# Patient Record
Sex: Female | Born: 1970 | Race: White | Hispanic: No | Marital: Married | Smoking: Former smoker
Health system: Southern US, Community
[De-identification: ages and names within clinical notes are randomized; demographics above are authoritative.]

## PROBLEM LIST (undated history)

## (undated) DIAGNOSIS — M1990 Unspecified osteoarthritis, unspecified site: Secondary | ICD-10-CM

## (undated) DIAGNOSIS — Z8489 Family history of other specified conditions: Secondary | ICD-10-CM

## (undated) HISTORY — PX: WISDOM TOOTH EXTRACTION: SHX21

## (undated) HISTORY — DX: Unspecified osteoarthritis, unspecified site: M19.90

---

## 2017-02-20 DIAGNOSIS — J189 Pneumonia, unspecified organism: Secondary | ICD-10-CM

## 2017-02-20 HISTORY — DX: Pneumonia, unspecified organism: J18.9

## 2020-03-22 ENCOUNTER — Other Ambulatory Visit: Payer: Self-pay | Admitting: Orthopedic Surgery

## 2020-03-22 DIAGNOSIS — M1711 Unilateral primary osteoarthritis, right knee: Secondary | ICD-10-CM | POA: Insufficient documentation

## 2020-03-22 DIAGNOSIS — N951 Menopausal and female climacteric states: Secondary | ICD-10-CM | POA: Insufficient documentation

## 2020-03-22 DIAGNOSIS — Z01811 Encounter for preprocedural respiratory examination: Secondary | ICD-10-CM

## 2020-03-31 NOTE — Patient Instructions (Addendum)
DUE TO COVID-19 ONLY ONE VISITOR IS ALLOWED TO COME WITH YOU AND STAY IN THE WAITING ROOM ONLY DURING PRE OP AND PROCEDURE DAY OF SURGERY. THE 1 VISITOR  MAY VISIT WITH YOU AFTER SURGERY IN YOUR PRIVATE ROOM DURING VISITING HOURS ONLY!  YOU NEED TO HAVE A COVID 19 TEST ON_2/15______ @_2 :05______, THIS TEST MUST BE DONE BEFORE SURGERY,  COVID TESTING SITE [address] [place] , IT IS ON THE RIGHT GOING OUT [address] APPROXIMATELY  2 MINUTES PAST [employer] ON THE RIGHT. ONCE YOUR COVID TEST IS COMPLETED,  PLEASE BEGIN THE QUARANTINE INSTRUCTIONS AS OUTLINED IN YOUR HANDOUT.                [id number]    Your procedure is scheduled on: 04/09/20   Report to [facility] Main  Entrance   Report to Short stay at 5:30 AM     Call this number if you have problems the morning of surgery [phone]    BRUSH YOUR TEETH MORNING OF SURGERY AND RINSE YOUR MOUTH OUT, NO CHEWING GUM CANDY OR MINTS.  No food after midnight.    You may have clear liquid until 4:30 AM.    At 4:00 AM drink pre surgery drink.   Nothing by mouth after 4:30 AM.    Take these medicines the morning of surgery with A SIP OF WATER: None                                 You may not have any metal on your body including hair pins and              piercings  Do not wear jewelry, make-up, lotions, powders or perfumes, deodorant             Do not wear nail polish on your fingernails.  Do not shave  48 hours prior to surgery.     Do not bring valuables to the hospital. [facility] IS NOT             RESPONSIBLE   FOR VALUABLES.  Contacts, dentures or bridgework may not be worn into surgery.      Patients discharged the day of surgery will not be allowed to drive home  . IF YOU ARE HAVING SURGERY AND GOING HOME THE SAME DAY, YOU MUST HAVE AN ADULT TO DRIVE YOU HOME AND BE WITH YOU FOR 24 HOURS.  YOU MAY GO HOME BY TAXI OR UBER OR ORTHERWISE, BUT AN ADULT MUST ACCOMPANY  YOU HOME AND STAY WITH YOU FOR 24 HOURS.  Name and phone number of your driver:  Special Instructions: N/A              Please read over the following fact sheets you were given: _____________________________________________________________________             [facility] - Preparing for Surgery Before surgery, you can play an important role.  Because skin is not sterile, your skin needs to be as free of germs as possible.  You can reduce the number of germs on your skin by washing with CHG (chlorahexidine gluconate) soap before surgery.  CHG is an antiseptic cleaner which kills germs and bonds with the skin to continue killing germs even after washing. Please DO NOT use if you have an allergy to CHG or antibacterial soaps.  If your skin becomes reddened/irritated stop using  the CHG and inform your nurse when you arrive at Short Stay. Do not shave (including legs and underarms) for at least 48 hours prior to the first CHG shower. . Please follow these instructions carefully:  1.  Shower with CHG Soap the night before surgery and the  morning of Surgery.  2.  If you choose to wash your hair, wash your hair first as usual with your  normal  shampoo.  3.  After you shampoo, rinse your hair and body thoroughly to remove the  shampoo.                                        4.  Use CHG as you would any other liquid soap.  You can apply chg directly  to the skin and wash                       Gently with a scrungie or clean washcloth.  5.  Apply the CHG Soap to your body ONLY FROM THE NECK DOWN.   Do not use on face/ open                           Wound or open sores. Avoid contact with eyes, ears mouth and genitals (private parts).                       Wash face,  Genitals (private parts) with your normal soap.             6.  Wash thoroughly, paying special attention to the area where your surgery  will be performed.  7.  Thoroughly rinse your body with warm water from the neck down.  8.  DO NOT  shower/wash with your normal soap after using and rinsing off  the CHG Soap.             9.  Pat yourself dry with a clean towel.            10.  Wear clean pajamas.            11.  Place clean sheets on your bed the night of your first shower and do not  sleep with pets. Day of Surgery : Do not apply any lotions/deodorants the morning of surgery.  Please wear clean clothes to the hospital/surgery center.  FAILURE TO FOLLOW THESE INSTRUCTIONS MAY RESULT IN THE CANCELLATION OF YOUR SURGERY PATIENT SIGNATURE_________________________________  NURSE SIGNATURE__________________________________  ________________________________________________________________________   Abigail Snyder  An incentive spirometer is a tool that can help keep your lungs clear and active. This tool measures how well you are filling your lungs with each breath. Taking long deep breaths may help reverse or decrease the chance of developing breathing (pulmonary) problems (especially infection) following:  A long period of time when you are unable to move or be active. BEFORE THE PROCEDURE   If the spirometer includes an indicator to show your best effort, your nurse or respiratory therapist will set it to a desired goal.  If possible, sit up straight or lean slightly forward. Try not to slouch.  Hold the incentive spirometer in an upright position. INSTRUCTIONS FOR USE  1. Sit on the edge of your bed if possible, or sit up as far as you can in bed or on a chair. 2.  Hold the incentive spirometer in an upright position. 3. Breathe out normally. 4. Place the mouthpiece in your mouth and seal your lips tightly around it. 5. Breathe in slowly and as deeply as possible, raising the piston or the ball toward the top of the column. 6. Hold your breath for 3-5 seconds or for as long as possible. Allow the piston or ball to fall to the bottom of the column. 7. Remove the mouthpiece from your mouth and breathe out  normally. 8. Rest for a few seconds and repeat Steps 1 through 7 at least 10 times every 1-2 hours when you are awake. Take your time and take a few normal breaths between deep breaths. 9. The spirometer may include an indicator to show your best effort. Use the indicator as a goal to work toward during each repetition. 10. After each set of 10 deep breaths, practice coughing to be sure your lungs are clear. If you have an incision (the cut made at the time of surgery), support your incision when coughing by placing a pillow or rolled up towels firmly against it. Once you are able to get out of bed, walk around indoors and cough well. You may stop using the incentive spirometer when instructed by your caregiver.  RISKS AND COMPLICATIONS  Take your time so you do not get dizzy or light-headed.  If you are in pain, you may need to take or ask for pain medication before doing incentive spirometry. It is harder to take a deep breath if you are having pain. AFTER USE  Rest and breathe slowly and easily.  It can be helpful to keep track of a log of your progress. Your caregiver can provide you with a simple table to help with this. If you are using the spirometer at home, follow these instructions: SEEK MEDICAL CARE IF:   You are having difficultly using the spirometer.  You have trouble using the spirometer as often as instructed.  Your pain medication is not giving enough relief while using the spirometer.  You develop fever of 100.5 F (38.1 C) or higher. SEEK IMMEDIATE MEDICAL CARE IF:   You cough up bloody sputum that had not been present before.  You develop fever of 102 F (38.9 C) or greater.  You develop worsening pain at or near the incision site. MAKE SURE YOU:   Understand these instructions.  Will watch your condition.  Will get help right away if you are not doing well or get worse. Document Released: 06/19/2006 Document Revised: 05/01/2011 Document Reviewed:  08/20/2006 [facility] Patient Information 2014 [place], [place].   ________________________________________________________________________

## 2020-04-01 ENCOUNTER — Ambulatory Visit (HOSPITAL_COMMUNITY)
Admission: RE | Admit: 2020-04-01 | Discharge: 2020-04-01 | Disposition: A | Discharge status: Home / Self Care | Payer: 59 | Source: Ambulatory Visit | Attending: Orthopedic Surgery | Admitting: Orthopedic Surgery

## 2020-04-01 ENCOUNTER — Encounter (HOSPITAL_COMMUNITY)
Admission: RE | Admit: 2020-04-01 | Discharge: 2020-04-01 | Disposition: A | Discharge status: Home / Self Care | Payer: 59 | Source: Ambulatory Visit | Attending: Orthopedic Surgery | Admitting: Orthopedic Surgery

## 2020-04-01 ENCOUNTER — Other Ambulatory Visit: Payer: Self-pay

## 2020-04-01 ENCOUNTER — Encounter (HOSPITAL_COMMUNITY): Payer: Self-pay

## 2020-04-01 ENCOUNTER — Encounter (INDEPENDENT_AMBULATORY_CARE_PROVIDER_SITE_OTHER): Payer: Self-pay

## 2020-04-01 DIAGNOSIS — Z01811 Encounter for preprocedural respiratory examination: Secondary | ICD-10-CM | POA: Diagnosis not present

## 2020-04-01 HISTORY — DX: Family history of other specified conditions: Z84.89

## 2020-04-01 LAB — CBC WITH DIFFERENTIAL/PLATELET
Abs Immature Granulocytes: 0.01 10*3/uL (ref 0.00–0.07)
Basophils Absolute: 0 10*3/uL (ref 0.0–0.1)
Basophils Relative: 1 %
Eosinophils Absolute: 0.1 10*3/uL (ref 0.0–0.5)
Eosinophils Relative: 1 %
HCT: 43.9 % (ref 36.0–46.0)
Hemoglobin: 14.4 g/dL (ref 12.0–15.0)
Immature Granulocytes: 0 %
Lymphocytes Relative: 35 %
Lymphs Abs: 2.3 10*3/uL (ref 0.7–4.0)
MCH: 31 pg (ref 26.0–34.0)
MCHC: 32.8 g/dL (ref 30.0–36.0)
MCV: 94.6 fL (ref 80.0–100.0)
Monocytes Absolute: 0.5 10*3/uL (ref 0.1–1.0)
Monocytes Relative: 8 %
Neutro Abs: 3.6 10*3/uL (ref 1.7–7.7)
Neutrophils Relative %: 55 %
Platelets: 184 10*3/uL (ref 150–400)
RBC: 4.64 MIL/uL (ref 3.87–5.11)
RDW: 12.7 % (ref 11.5–15.5)
WBC: 6.5 10*3/uL (ref 4.0–10.5)
nRBC: 0 % (ref 0.0–0.2)

## 2020-04-01 LAB — COMPREHENSIVE METABOLIC PANEL
ALT: 17 U/L (ref 0–44)
AST: 17 U/L (ref 15–41)
Albumin: 4.4 g/dL (ref 3.5–5.0)
Alkaline Phosphatase: 68 U/L (ref 38–126)
Anion gap: 8 (ref 5–15)
BUN: 16 mg/dL (ref 6–20)
CO2: 27 mmol/L (ref 22–32)
Calcium: 9.3 mg/dL (ref 8.9–10.3)
Chloride: 104 mmol/L (ref 98–111)
Creatinine, Ser: 0.54 mg/dL (ref 0.44–1.00)
GFR, Estimated: >60 mL/min (ref >60)
Glucose, Bld: 95 mg/dL (ref 70–99)
Potassium: 4.7 mmol/L (ref 3.5–5.1)
Sodium: 139 mmol/L (ref 135–145)
Total Bilirubin: 0.7 mg/dL (ref 0.3–1.2)
Total Protein: 7.7 g/dL (ref 6.5–8.1)

## 2020-04-01 LAB — URINALYSIS, ROUTINE W REFLEX MICROSCOPIC
Bilirubin Urine: NEGATIVE
Glucose, UA: NEGATIVE mg/dL
Hgb urine dipstick: NEGATIVE
Ketones, ur: NEGATIVE mg/dL
Leukocytes,Ua: NEGATIVE
Nitrite: NEGATIVE
Protein, ur: NEGATIVE mg/dL
Specific Gravity, Urine: 1.016 (ref 1.005–1.030)
pH: 7 (ref 5.0–8.0)

## 2020-04-01 LAB — SURGICAL PCR SCREEN
MRSA, PCR: NEGATIVE
Staphylococcus aureus: NEGATIVE

## 2020-04-01 LAB — APTT: aPTT: 30 seconds (ref 24–36)

## 2020-04-01 LAB — PROTIME-INR
INR: 1 (ref 0.8–1.2)
Prothrombin Time: 13.1 seconds (ref 11.4–15.2)

## 2020-04-01 NOTE — Progress Notes (Addendum)
COVID Vaccine Completed:Yes Date COVID Vaccine completed:10/11/19 -Booster 02/10/20-Moderna COVID vaccine manufacturer: Pfizer       PCP - Dr. Rejeana Brock Cardiologist - no  Chest x-ray - 04/12/19-epic EKG - 03/22/20-epic Stress Test - no ECHO - no Cardiac Cath - no Pacemaker/ICD device last checked:NA  Sleep Study - no CPAP -   Fasting Blood Sugar - NA Checks Blood Sugar _____ times a day  Blood Thinner Instructions:NA Aspirin Instructions: Last Dose:  Anesthesia review:   Patient denies shortness of breath, fever, cough and chest pain at PAT appointment yes  Patient verbalized understanding of instructions that were given to them at the PAT appointment. Patient was also instructed that they will need to review over the PAT instructions again at home before surgery. Yes. pt has a physical job and no SOB with any activities.

## 2020-04-06 ENCOUNTER — Other Ambulatory Visit (HOSPITAL_COMMUNITY)
Admission: RE | Admit: 2020-04-06 | Discharge: 2020-04-06 | Disposition: A | Discharge status: Home / Self Care | Payer: 59 | Source: Ambulatory Visit | Attending: Orthopedic Surgery | Admitting: Orthopedic Surgery

## 2020-04-06 DIAGNOSIS — Z20822 Contact with and (suspected) exposure to covid-19: Secondary | ICD-10-CM | POA: Diagnosis not present

## 2020-04-06 DIAGNOSIS — Z01812 Encounter for preprocedural laboratory examination: Secondary | ICD-10-CM | POA: Diagnosis present

## 2020-04-06 LAB — SARS CORONAVIRUS 2 (TAT 6-24 HRS): SARS Coronavirus 2: NEGATIVE

## 2020-04-08 ENCOUNTER — Encounter (HOSPITAL_COMMUNITY): Payer: Self-pay | Admitting: Orthopedic Surgery

## 2020-04-08 MED ORDER — BUPIVACAINE LIPOSOME 1.3 % IJ SUSP
20.0000 mL | Freq: Once | INTRAMUSCULAR | Status: DC
  Filled 2020-04-08: qty 20

## 2020-04-08 NOTE — Anesthesia Preprocedure Evaluation (Addendum)
Anesthesia Evaluation  Patient identified by MRN, date of birth, ID band Patient awake    Reviewed: Allergy & Precautions, NPO status , Patient's Chart, lab work & pertinent test results  Airway Mallampati: II  TM Distance: >3 FB Neck ROM: Full    Dental  (+) Dental Advisory Given   Pulmonary former smoker,    breath sounds clear to auscultation       Cardiovascular negative cardio ROS   Rhythm:Regular Rate:Normal     Neuro/Psych negative neurological ROS     GI/Hepatic negative GI ROS, Neg liver ROS,   Endo/Other  negative endocrine ROS  Renal/GU negative Renal ROS     Musculoskeletal   Abdominal   Peds  Hematology negative hematology ROS (+)   Anesthesia Other Findings   Reproductive/Obstetrics                            Lab Results  Component Value Date   WBC 6.5 04/01/2020   HGB 14.4 04/01/2020   HCT 43.9 04/01/2020   MCV 94.6 04/01/2020   PLT 184 04/01/2020   Lab Results  Component Value Date   CREATININE 0.54 04/01/2020   BUN 16 04/01/2020   NA 139 04/01/2020   K 4.7 04/01/2020   CL 104 04/01/2020   CO2 27 04/01/2020    Anesthesia Physical Anesthesia Plan  ASA: I  Anesthesia Plan: Spinal   Post-op Pain Management:  Regional for Post-op pain   Induction:   PONV Risk Score and Plan: 2 and Propofol infusion, Ondansetron and Treatment may vary due to age or medical condition  Airway Management Planned: Natural Airway and Simple Face Mask  Additional Equipment:   Intra-op Plan:   Post-operative Plan:   Informed Consent: I have reviewed the patients History and Physical, chart, labs and discussed the procedure including the risks, benefits and alternatives for the proposed anesthesia with the patient or authorized representative who has indicated his/her understanding and acceptance.       Plan Discussed with: CRNA  Anesthesia Plan Comments:         Anesthesia Quick Evaluation

## 2020-04-09 ENCOUNTER — Observation Stay (HOSPITAL_COMMUNITY)
Admission: RE | Admit: 2020-04-09 | Discharge: 2020-04-10 | Disposition: A | Discharge status: Home / Self Care | Payer: 59 | Attending: Orthopedic Surgery | Admitting: Orthopedic Surgery

## 2020-04-09 ENCOUNTER — Encounter (HOSPITAL_COMMUNITY)
Admission: RE | Disposition: A | Discharge status: Home / Self Care | Payer: Self-pay | Source: Home / Self Care | Attending: Orthopedic Surgery

## 2020-04-09 ENCOUNTER — Ambulatory Visit (HOSPITAL_COMMUNITY): Payer: 59 | Admitting: Anesthesiology

## 2020-04-09 ENCOUNTER — Encounter (HOSPITAL_COMMUNITY): Payer: Self-pay | Admitting: Orthopedic Surgery

## 2020-04-09 DIAGNOSIS — M25561 Pain in right knee: Secondary | ICD-10-CM | POA: Diagnosis present

## 2020-04-09 DIAGNOSIS — M1711 Unilateral primary osteoarthritis, right knee: Principal | ICD-10-CM | POA: Insufficient documentation

## 2020-04-09 DIAGNOSIS — Z87891 Personal history of nicotine dependence: Secondary | ICD-10-CM | POA: Insufficient documentation

## 2020-04-09 DIAGNOSIS — Z96651 Presence of right artificial knee joint: Secondary | ICD-10-CM

## 2020-04-09 HISTORY — PX: TOTAL KNEE ARTHROPLASTY: SHX125

## 2020-04-09 LAB — TYPE AND SCREEN
ABO/RH(D): O NEG
Antibody Screen: NEGATIVE

## 2020-04-09 LAB — ABO/RH: ABO/RH(D): O NEG

## 2020-04-09 SURGERY — ARTHROPLASTY, KNEE, TOTAL
Anesthesia: Spinal | Site: Knee | Laterality: Right

## 2020-04-09 MED ORDER — DEXAMETHASONE SODIUM PHOSPHATE 10 MG/ML IJ SOLN
10.0000 mg | Freq: Two times a day (BID) | INTRAMUSCULAR | Status: DC
  Administered 2020-04-10: 10 mg via INTRAVENOUS
  Filled 2020-04-09: qty 1

## 2020-04-09 MED ORDER — LACTATED RINGERS IV BOLUS
500.0000 mL | Freq: Once | INTRAVENOUS | Status: AC
  Administered 2020-04-09: 500 mL via INTRAVENOUS

## 2020-04-09 MED ORDER — PROPOFOL 10 MG/ML IV BOLUS
INTRAVENOUS | Status: DC | PRN
  Administered 2020-04-09: 20 mg via INTRAVENOUS
  Administered 2020-04-09: 130 mg via INTRAVENOUS

## 2020-04-09 MED ORDER — DIPHENHYDRAMINE HCL 12.5 MG/5ML PO ELIX: 12.5000 mg | ORAL_SOLUTION | ORAL | Status: DC | PRN

## 2020-04-09 MED ORDER — BISACODYL 5 MG PO TBEC: 5.0000 mg | DELAYED_RELEASE_TABLET | Freq: Every day | ORAL | Status: DC | PRN

## 2020-04-09 MED ORDER — PHENYLEPHRINE HCL-NACL 10-0.9 MG/250ML-% IV SOLN
INTRAVENOUS | Status: DC | PRN
  Administered 2020-04-09: 15 ug/min via INTRAVENOUS

## 2020-04-09 MED ORDER — DEXAMETHASONE SODIUM PHOSPHATE 10 MG/ML IJ SOLN
INTRAMUSCULAR | Status: DC | PRN
  Administered 2020-04-09: 8 mg via INTRAVENOUS

## 2020-04-09 MED ORDER — EPHEDRINE SULFATE-NACL 50-0.9 MG/10ML-% IV SOSY
PREFILLED_SYRINGE | INTRAVENOUS | Status: DC | PRN
  Administered 2020-04-09: 5 mg via INTRAVENOUS

## 2020-04-09 MED ORDER — BUPIVACAINE-EPINEPHRINE (PF) 0.5% -1:200000 IJ SOLN
INTRAMUSCULAR | Status: AC
  Filled 2020-04-09: qty 30

## 2020-04-09 MED ORDER — SODIUM CHLORIDE 0.9 % IV SOLN
INTRAVENOUS | Status: DC
  Administered 2020-04-09: 1000 mL via INTRAVENOUS

## 2020-04-09 MED ORDER — AMISULPRIDE (ANTIEMETIC) 5 MG/2ML IV SOLN
INTRAVENOUS | Status: AC
  Filled 2020-04-09: qty 4

## 2020-04-09 MED ORDER — PHENOL 1.4 % MT LIQD: 1.0000 | OROMUCOSAL | Status: DC | PRN

## 2020-04-09 MED ORDER — FENTANYL CITRATE (PF) 100 MCG/2ML IJ SOLN
INTRAMUSCULAR | Status: DC | PRN
  Administered 2020-04-09 (×3): 25 ug via INTRAVENOUS
  Administered 2020-04-09 (×2): 50 ug via INTRAVENOUS
  Administered 2020-04-09: 25 ug via INTRAVENOUS

## 2020-04-09 MED ORDER — METHOCARBAMOL 500 MG IVPB - SIMPLE MED
INTRAVENOUS | Status: AC
  Administered 2020-04-09: 500 mg via INTRAVENOUS
  Filled 2020-04-09: qty 50

## 2020-04-09 MED ORDER — FENTANYL CITRATE (PF) 100 MCG/2ML IJ SOLN
INTRAMUSCULAR | Status: AC
  Filled 2020-04-09: qty 2

## 2020-04-09 MED ORDER — POLYETHYLENE GLYCOL 3350 17 G PO PACK: 17.0000 g | PACK | Freq: Every day | ORAL | Status: DC | PRN

## 2020-04-09 MED ORDER — ALUM & MAG HYDROXIDE-SIMETH 200-200-20 MG/5ML PO SUSP
30.0000 mL | ORAL | Status: DC | PRN
  Filled 2020-04-09: qty 30

## 2020-04-09 MED ORDER — HYDROMORPHONE HCL 1 MG/ML IJ SOLN
INTRAMUSCULAR | Status: AC
  Administered 2020-04-09: 1 mg via INTRAVENOUS
  Filled 2020-04-09: qty 1

## 2020-04-09 MED ORDER — PROPOFOL 500 MG/50ML IV EMUL
INTRAVENOUS | Status: AC
  Filled 2020-04-09: qty 50

## 2020-04-09 MED ORDER — OXYCODONE HCL 5 MG PO TABS
10.0000 mg | ORAL_TABLET | ORAL | Status: DC | PRN
  Administered 2020-04-09 – 2020-04-10 (×2): 15 mg via ORAL
  Filled 2020-04-09 (×2): qty 3

## 2020-04-09 MED ORDER — DEXMEDETOMIDINE (PRECEDEX) IN NS 20 MCG/5ML (4 MCG/ML) IV SYRINGE
PREFILLED_SYRINGE | INTRAVENOUS | Status: DC | PRN
  Administered 2020-04-09 (×2): 4 ug via INTRAVENOUS

## 2020-04-09 MED ORDER — METOCLOPRAMIDE HCL 5 MG/ML IJ SOLN
5.0000 mg | Freq: Three times a day (TID) | INTRAMUSCULAR | Status: DC | PRN
  Administered 2020-04-09: 10 mg via INTRAVENOUS

## 2020-04-09 MED ORDER — ACETAMINOPHEN 500 MG PO TABS
1000.0000 mg | ORAL_TABLET | Freq: Once | ORAL | Status: AC
  Administered 2020-04-09: 1000 mg via ORAL
  Filled 2020-04-09: qty 2

## 2020-04-09 MED ORDER — SODIUM CHLORIDE 0.9 % IR SOLN
Status: DC | PRN
  Administered 2020-04-09: 1000 mL

## 2020-04-09 MED ORDER — ONDANSETRON HCL 4 MG PO TABS: 4.0000 mg | ORAL_TABLET | Freq: Four times a day (QID) | ORAL | Status: DC | PRN

## 2020-04-09 MED ORDER — LACTATED RINGERS IV SOLN: INTRAVENOUS | Status: DC

## 2020-04-09 MED ORDER — AMISULPRIDE (ANTIEMETIC) 5 MG/2ML IV SOLN
10.0000 mg | Freq: Once | INTRAVENOUS | Status: AC | PRN
  Administered 2020-04-09: 10 mg via INTRAVENOUS

## 2020-04-09 MED ORDER — BUPIVACAINE LIPOSOME 1.3 % IJ SUSP
INTRAMUSCULAR | Status: DC | PRN
  Administered 2020-04-09: 20 mL

## 2020-04-09 MED ORDER — POVIDONE-IODINE 10 % EX SWAB
2.0000 "application " | Freq: Once | CUTANEOUS | Status: AC
  Administered 2020-04-09: 2 via TOPICAL

## 2020-04-09 MED ORDER — METOCLOPRAMIDE HCL 5 MG/ML IJ SOLN
INTRAMUSCULAR | Status: AC
  Filled 2020-04-09: qty 2

## 2020-04-09 MED ORDER — METOCLOPRAMIDE HCL 5 MG PO TABS: 5.0000 mg | ORAL_TABLET | Freq: Three times a day (TID) | ORAL | Status: DC | PRN

## 2020-04-09 MED ORDER — ONDANSETRON HCL 4 MG/2ML IJ SOLN
INTRAMUSCULAR | Status: AC
  Filled 2020-04-09: qty 2

## 2020-04-09 MED ORDER — HYDROMORPHONE HCL 1 MG/ML IJ SOLN
INTRAMUSCULAR | Status: AC
  Filled 2020-04-09: qty 1

## 2020-04-09 MED ORDER — EPHEDRINE 5 MG/ML INJ
INTRAVENOUS | Status: AC
  Filled 2020-04-09: qty 10

## 2020-04-09 MED ORDER — ONDANSETRON HCL 4 MG/2ML IJ SOLN
INTRAMUSCULAR | Status: AC
  Administered 2020-04-09: 4 mg via INTRAVENOUS
  Filled 2020-04-09: qty 2

## 2020-04-09 MED ORDER — GLYCOPYRROLATE PF 0.2 MG/ML IJ SOSY
PREFILLED_SYRINGE | INTRAMUSCULAR | Status: AC
  Filled 2020-04-09: qty 2

## 2020-04-09 MED ORDER — LIDOCAINE 2% (20 MG/ML) 5 ML SYRINGE
INTRAMUSCULAR | Status: DC | PRN
  Administered 2020-04-09: 60 mg via INTRAVENOUS
  Administered 2020-04-09: 40 mg via INTRAVENOUS

## 2020-04-09 MED ORDER — OXYCODONE HCL 5 MG PO TABS
ORAL_TABLET | ORAL | Status: AC
  Filled 2020-04-09: qty 1

## 2020-04-09 MED ORDER — BUPIVACAINE-EPINEPHRINE 0.5% -1:200000 IJ SOLN
INTRAMUSCULAR | Status: DC | PRN
  Administered 2020-04-09: 30 mL

## 2020-04-09 MED ORDER — TRANEXAMIC ACID-NACL 1000-0.7 MG/100ML-% IV SOLN: 1000.0000 mg | Freq: Once | INTRAVENOUS | Status: AC

## 2020-04-09 MED ORDER — WATER FOR IRRIGATION, STERILE IR SOLN
Status: DC | PRN
  Administered 2020-04-09: 2000 mL

## 2020-04-09 MED ORDER — ONDANSETRON HCL 4 MG/2ML IJ SOLN
4.0000 mg | Freq: Four times a day (QID) | INTRAMUSCULAR | Status: DC | PRN
  Administered 2020-04-09: 4 mg via INTRAVENOUS
  Filled 2020-04-09: qty 2

## 2020-04-09 MED ORDER — MAGNESIUM CITRATE PO SOLN
1.0000 | Freq: Once | ORAL | Status: DC | PRN
Start: 2020-04-09 — End: 2020-04-10

## 2020-04-09 MED ORDER — CEFAZOLIN SODIUM-DEXTROSE 2-4 GM/100ML-% IV SOLN: 2.0000 g | Freq: Four times a day (QID) | INTRAVENOUS | Status: DC

## 2020-04-09 MED ORDER — ASPIRIN EC 325 MG PO TBEC
325.0000 mg | DELAYED_RELEASE_TABLET | Freq: Two times a day (BID) | ORAL | Status: DC
  Administered 2020-04-09 – 2020-04-10 (×2): 325 mg via ORAL
  Filled 2020-04-09 (×2): qty 1

## 2020-04-09 MED ORDER — CEFAZOLIN SODIUM-DEXTROSE 2-4 GM/100ML-% IV SOLN
2.0000 g | INTRAVENOUS | Status: AC
  Administered 2020-04-09: 2 g via INTRAVENOUS
  Filled 2020-04-09: qty 100

## 2020-04-09 MED ORDER — KETOROLAC TROMETHAMINE 30 MG/ML IJ SOLN
INTRAMUSCULAR | Status: AC
  Filled 2020-04-09: qty 1

## 2020-04-09 MED ORDER — LACTATED RINGERS IV BOLUS: 250.0000 mL | Freq: Once | INTRAVENOUS | Status: DC

## 2020-04-09 MED ORDER — ONDANSETRON HCL 4 MG/2ML IJ SOLN
INTRAMUSCULAR | Status: DC | PRN
  Administered 2020-04-09: 4 mg via INTRAVENOUS

## 2020-04-09 MED ORDER — BUPIVACAINE IN DEXTROSE 0.75-8.25 % IT SOLN
INTRATHECAL | Status: DC | PRN
  Administered 2020-04-09: 1.6 mL via INTRATHECAL

## 2020-04-09 MED ORDER — OXYCODONE HCL 5 MG PO TABS
5.0000 mg | ORAL_TABLET | ORAL | Status: DC | PRN
  Administered 2020-04-09: 5 mg via ORAL

## 2020-04-09 MED ORDER — 0.9 % SODIUM CHLORIDE (POUR BTL) OPTIME
TOPICAL | Status: DC | PRN
  Administered 2020-04-09: 1000 mL

## 2020-04-09 MED ORDER — LIDOCAINE HCL (PF) 2 % IJ SOLN
INTRAMUSCULAR | Status: AC
  Filled 2020-04-09: qty 5

## 2020-04-09 MED ORDER — FENTANYL CITRATE (PF) 100 MCG/2ML IJ SOLN
25.0000 ug | INTRAMUSCULAR | Status: DC | PRN
  Administered 2020-04-09 (×3): 50 ug via INTRAVENOUS

## 2020-04-09 MED ORDER — KETOROLAC TROMETHAMINE 30 MG/ML IJ SOLN: 30.0000 mg | Freq: Once | INTRAMUSCULAR | Status: DC

## 2020-04-09 MED ORDER — HYDROMORPHONE HCL 1 MG/ML IJ SOLN
0.5000 mg | INTRAMUSCULAR | Status: DC | PRN
  Administered 2020-04-09 (×3): 0.5 mg via INTRAVENOUS

## 2020-04-09 MED ORDER — METHOCARBAMOL 500 MG IVPB - SIMPLE MED
500.0000 mg | Freq: Four times a day (QID) | INTRAVENOUS | Status: DC | PRN
  Filled 2020-04-09: qty 50

## 2020-04-09 MED ORDER — CHLORHEXIDINE GLUCONATE CLOTH 2 % EX PADS: 6.0000 | MEDICATED_PAD | Freq: Every day | CUTANEOUS | Status: DC

## 2020-04-09 MED ORDER — CHLORHEXIDINE GLUCONATE 0.12 % MT SOLN: 15.0000 mL | Freq: Once | OROMUCOSAL | Status: AC

## 2020-04-09 MED ORDER — ORAL CARE MOUTH RINSE
15.0000 mL | Freq: Once | OROMUCOSAL | Status: AC
  Administered 2020-04-09: 15 mL via OROMUCOSAL

## 2020-04-09 MED ORDER — METHOCARBAMOL 500 MG PO TABS
500.0000 mg | ORAL_TABLET | Freq: Four times a day (QID) | ORAL | Status: DC | PRN
  Administered 2020-04-09 – 2020-04-10 (×3): 500 mg via ORAL
  Filled 2020-04-09 (×3): qty 1

## 2020-04-09 MED ORDER — CEFAZOLIN SODIUM-DEXTROSE 2-4 GM/100ML-% IV SOLN
2.0000 g | Freq: Once | INTRAVENOUS | Status: AC
  Administered 2020-04-09: 2 g via INTRAVENOUS
  Filled 2020-04-09: qty 100

## 2020-04-09 MED ORDER — DEXAMETHASONE SODIUM PHOSPHATE 10 MG/ML IJ SOLN
INTRAMUSCULAR | Status: AC
  Filled 2020-04-09: qty 1

## 2020-04-09 MED ORDER — ROPIVACAINE HCL 5 MG/ML IJ SOLN
INTRAMUSCULAR | Status: DC | PRN
Start: 2020-04-09 — End: 2020-04-09
  Administered 2020-04-09: 20 mL via PERINEURAL

## 2020-04-09 MED ORDER — KETOROLAC TROMETHAMINE 30 MG/ML IJ SOLN
30.0000 mg | Freq: Once | INTRAMUSCULAR | Status: AC
  Administered 2020-04-09: 30 mg via INTRAVENOUS

## 2020-04-09 MED ORDER — DOCUSATE SODIUM 100 MG PO CAPS
100.0000 mg | ORAL_CAPSULE | Freq: Two times a day (BID) | ORAL | Status: DC
  Administered 2020-04-09 – 2020-04-10 (×2): 100 mg via ORAL
  Filled 2020-04-09 (×2): qty 1

## 2020-04-09 MED ORDER — HYDROMORPHONE HCL 1 MG/ML IJ SOLN
0.5000 mg | INTRAMUSCULAR | Status: DC | PRN
  Administered 2020-04-10: 0.5 mg via INTRAVENOUS
  Administered 2020-04-10 (×2): 1 mg via INTRAVENOUS
  Filled 2020-04-09 (×3): qty 1

## 2020-04-09 MED ORDER — CEFAZOLIN SODIUM-DEXTROSE 2-4 GM/100ML-% IV SOLN
INTRAVENOUS | Status: AC
  Administered 2020-04-09: 2 g via INTRAVENOUS
  Filled 2020-04-09: qty 100

## 2020-04-09 MED ORDER — TRANEXAMIC ACID-NACL 1000-0.7 MG/100ML-% IV SOLN
1000.0000 mg | INTRAVENOUS | Status: AC
  Administered 2020-04-09: 1000 mg via INTRAVENOUS
  Filled 2020-04-09: qty 100

## 2020-04-09 MED ORDER — ACETAMINOPHEN 325 MG PO TABS
325.0000 mg | ORAL_TABLET | Freq: Four times a day (QID) | ORAL | Status: DC | PRN
  Filled 2020-04-09: qty 2

## 2020-04-09 MED ORDER — TRANEXAMIC ACID-NACL 1000-0.7 MG/100ML-% IV SOLN
INTRAVENOUS | Status: AC
  Administered 2020-04-09: 1000 mg via INTRAVENOUS
  Filled 2020-04-09: qty 100

## 2020-04-09 MED ORDER — PROPOFOL 500 MG/50ML IV EMUL
INTRAVENOUS | Status: DC | PRN
  Administered 2020-04-09: 135 ug/kg/min via INTRAVENOUS

## 2020-04-09 MED ORDER — MIDAZOLAM HCL 5 MG/5ML IJ SOLN
INTRAMUSCULAR | Status: DC | PRN
  Administered 2020-04-09: 2 mg via INTRAVENOUS

## 2020-04-09 MED ORDER — PROPOFOL 1000 MG/100ML IV EMUL
INTRAVENOUS | Status: AC
  Filled 2020-04-09: qty 100

## 2020-04-09 MED ORDER — SODIUM CHLORIDE 0.9% FLUSH
INTRAVENOUS | Status: DC | PRN
  Administered 2020-04-09: 50 mL

## 2020-04-09 MED ORDER — MENTHOL 3 MG MT LOZG: 1.0000 | LOZENGE | OROMUCOSAL | Status: DC | PRN

## 2020-04-09 MED ORDER — PHENYLEPHRINE HCL-NACL 10-0.9 MG/250ML-% IV SOLN
INTRAVENOUS | Status: AC
  Filled 2020-04-09: qty 250

## 2020-04-09 MED ORDER — PROPOFOL 10 MG/ML IV BOLUS
INTRAVENOUS | Status: AC
  Filled 2020-04-09: qty 40

## 2020-04-09 MED ORDER — MIDAZOLAM HCL 2 MG/2ML IJ SOLN
INTRAMUSCULAR | Status: AC
  Filled 2020-04-09: qty 2

## 2020-04-09 SURGICAL SUPPLY — 52 items
ATTUNE MED DOME PAT 38 KNEE (Knees) ×2 IMPLANT
ATTUNE PS FEM RT SZ 5 CEM KNEE (Femur) ×2 IMPLANT
ATTUNE PSRP INSR SZ 5 10M KNEE (Insert) ×2 IMPLANT
BAG ZIPLOCK 12X15 (MISCELLANEOUS) ×2 IMPLANT
BASE TIBIAL ROT PLAT SZ 5 KNEE (Knees) ×1 IMPLANT
BENZOIN TINCTURE PRP APPL 2/3 (GAUZE/BANDAGES/DRESSINGS) ×2 IMPLANT
BLADE SAGITTAL 25.0X1.19X90 (BLADE) ×2 IMPLANT
BLADE SAW SGTL 13.0X1.19X90.0M (BLADE) ×2 IMPLANT
BLADE SURG SZ10 CARB STEEL (BLADE) ×4 IMPLANT
BNDG ELASTIC 6X5.8 VLCR STR LF (GAUZE/BANDAGES/DRESSINGS) ×2 IMPLANT
BOOTIES KNEE HIGH SLOAN (MISCELLANEOUS) ×2 IMPLANT
BOWL SMART MIX CTS (DISPOSABLE) ×2 IMPLANT
CEMENT HV SMART SET (Cement) ×4 IMPLANT
COVER SURGICAL LIGHT HANDLE (MISCELLANEOUS) ×2 IMPLANT
COVER WAND RF STERILE (DRAPES) ×2 IMPLANT
CUFF TOURN SGL QUICK 34 (TOURNIQUET CUFF) ×1
CUFF TRNQT CYL 34X4.125X (TOURNIQUET CUFF) ×1 IMPLANT
DECANTER SPIKE VIAL GLASS SM (MISCELLANEOUS) ×4 IMPLANT
DRAPE U-SHAPE 47X51 STRL (DRAPES) ×2 IMPLANT
DRSG AQUACEL AG ADV 3.5X10 (GAUZE/BANDAGES/DRESSINGS) ×2 IMPLANT
DURAPREP 26ML APPLICATOR (WOUND CARE) ×2 IMPLANT
ELECT REM PT RETURN 15FT ADLT (MISCELLANEOUS) ×2 IMPLANT
GLOVE SRG 8 PF TXTR STRL LF DI (GLOVE) ×2 IMPLANT
GLOVE SURG LTX SZ7.5 (GLOVE) ×4 IMPLANT
GLOVE SURG UNDER POLY LF SZ8 (GLOVE) ×2
GOWN STRL REUS W/TWL XL LVL3 (GOWN DISPOSABLE) ×4 IMPLANT
HANDPIECE INTERPULSE COAX TIP (DISPOSABLE) ×1
HOLDER FOLEY CATH W/STRAP (MISCELLANEOUS) ×2 IMPLANT
HOOD PEEL AWAY FLYTE STAYCOOL (MISCELLANEOUS) ×6 IMPLANT
KIT TURNOVER KIT A (KITS) ×2 IMPLANT
MANIFOLD NEPTUNE II (INSTRUMENTS) ×2 IMPLANT
NEEDLE HYPO 22GX1.5 SAFETY (NEEDLE) ×4 IMPLANT
NS IRRIG 1000ML POUR BTL (IV SOLUTION) ×2 IMPLANT
PACK ICE MAXI GEL EZY WRAP (MISCELLANEOUS) ×2 IMPLANT
PACK TOTAL KNEE CUSTOM (KITS) ×2 IMPLANT
PADDING CAST COTTON 6X4 STRL (CAST SUPPLIES) ×2 IMPLANT
PENCIL SMOKE EVACUATOR (MISCELLANEOUS) IMPLANT
PIN DRILL FIX HALF THREAD (BIT) ×2 IMPLANT
PIN STEINMAN FIXATION KNEE (PIN) ×2 IMPLANT
PROTECTOR NERVE ULNAR (MISCELLANEOUS) ×2 IMPLANT
SET HNDPC FAN SPRY TIP SCT (DISPOSABLE) ×1 IMPLANT
STRIP CLOSURE SKIN 1/2X4 (GAUZE/BANDAGES/DRESSINGS) ×4 IMPLANT
SUT MNCRL AB 3-0 PS2 18 (SUTURE) ×2 IMPLANT
SUT VIC AB 0 CT1 36 (SUTURE) ×2 IMPLANT
SUT VIC AB 1 CT1 36 (SUTURE) ×4 IMPLANT
SUT VIC AB 2-0 CT1 27 (SUTURE) ×1
SUT VIC AB 2-0 CT1 TAPERPNT 27 (SUTURE) ×1 IMPLANT
SYR CONTROL 10ML LL (SYRINGE) ×4 IMPLANT
TIBIAL BASE ROT PLAT SZ 5 KNEE (Knees) ×2 IMPLANT
TRAY FOLEY MTR SLVR 16FR STAT (SET/KITS/TRAYS/PACK) ×2 IMPLANT
WATER STERILE IRR 1000ML POUR (IV SOLUTION) ×4 IMPLANT
WRAP KNEE MAXI GEL POST OP (GAUZE/BANDAGES/DRESSINGS) ×2 IMPLANT

## 2020-04-09 NOTE — Anesthesia Procedure Notes (Addendum)
Spinal  Patient location during procedure: OR Start time: 04/09/2020 7:12 AM End time: 04/09/2020 7:16 AM Staffing Performed: anesthesiologist  Anesthesiologist: Marcene Duos, MD Preanesthetic Checklist Completed: patient identified, IV checked, site marked, risks and benefits discussed, surgical consent, monitors and equipment checked, pre-op evaluation and timeout performed Spinal Block Patient position: sitting Prep: DuraPrep Patient monitoring: heart rate, cardiac monitor, continuous pulse ox and blood pressure Approach: midline Location: L4-5 Injection technique: single-shot Needle Needle type: Pencan  Needle gauge: 24 G Needle length: 9 cm Assessment Sensory level: T4 Events: failed spinal Additional Notes Free flowing CSF. Aspiration before and after injection LA. Pt still wiggling toes with stimulation prior to incision. Converted to GA with LMA prior to incision for failed spinal.

## 2020-04-09 NOTE — H&P (Addendum)
TOTAL KNEE ADMISSION H&P  Patient is being admitted for right total knee arthroplasty.  Subjective:  Chief Complaint:right knee pain.  HPI: Abigail Snyder, 50 y.o. female, has a history of pain and functional disability in the right knee due to arthritis and has failed non-surgical conservative treatments for greater than 12 weeks to includeNSAID's and/or analgesics, corticosteriod injections, viscosupplementation injections, weight reduction as appropriate and activity modification.  Onset of symptoms was gradual, starting 3 years ago with gradually worsening course since that time. The patient noted no past surgery on the right knee(s).  Patient currently rates pain in the right knee(s) at 9 out of 10 with activity. Patient has night pain, worsening of pain with activity and weight bearing, pain that interferes with activities of daily living, pain with passive range of motion and joint swelling.  Patient has evidence of subchondral cysts, subchondral sclerosis, periarticular osteophytes and joint space narrowing by imaging studies. This patient has had Failure of all reasonable conservative care. There is no active infection.  There are no problems to display for this patient.  Past Medical History:  Diagnosis Date  . Family history of adverse reaction to anesthesia    slow to wake up  . Pneumonia 2019    Past Surgical History:  Procedure Laterality Date  . WISDOM TOOTH EXTRACTION      Current Facility-Administered Medications  Medication Dose Route Frequency Provider Last Rate Last Admin  . bupivacaine liposome (EXPAREL) 1.3 % injection 266 mg  20 mL Other Once Jodi Geralds, MD      . ceFAZolin (ANCEF) IVPB 2g/100 mL premix  2 g Intravenous On Call to OR Jodi Geralds, MD      . lactated ringers infusion   Intravenous Continuous Lewie Loron, MD 10 mL/hr at 04/09/20 0610 Continued from Pre-op at 04/09/20 0610  . phenylephrine (NEOSYNEPHRINE) 10-0.9 MG/250ML-% infusion           .  tranexamic acid (CYKLOKAPRON) IVPB 1,000 mg  1,000 mg Intravenous To OR Jodi Geralds, MD       Facility-Administered Medications Ordered in Other Encounters  Medication Dose Route Frequency Provider Last Rate Last Admin  . fentaNYL (SUBLIMAZE) injection   Intravenous Anesthesia Intra-op Key, Kristopher, CRNA   50 mcg at 04/09/20 0645  . midazolam (VERSED) 5 MG/5ML injection   Intravenous Anesthesia Intra-op Key, Kristopher, CRNA   2 mg at 04/09/20 0645   Allergies  Allergen Reactions  . Bee Venom Anaphylaxis    Social History   Tobacco Use  . Smoking status: Former Smoker    Packs/day: 1.00    Years: 10.00    Pack years: 10.00    Types: Cigarettes    Quit date: 04/01/2001    Years since quitting: 19.0  . Smokeless tobacco: Never Used  Substance Use Topics  . Alcohol use: Never    History reviewed. No pertinent family history.   Review of Systems ROS: I have reviewed the patient's review of systems thoroughly and there are no positive responses as relates to the HPI. Objective:  Physical Exam  Vital signs in last 24 hours: Temp:  [98 F (36.7 C)] 98 F (36.7 C) (02/18 0620) Pulse Rate:  [64] 64 (02/18 0620) Resp:  [16] 16 (02/18 0620) BP: (116)/(69) 116/69 (02/18 0620) SpO2:  [100 %] 100 % (02/18 0620) Well-developed well-nourished patient in no acute distress. Alert and oriented x3 HEENT:within normal limits Cardiac: Regular rate and rhythm Pulmonary: Lungs clear to auscultation Abdomen: Soft and nontender.  Normal active  bowel sounds  Musculoskeletal: Right knee: Painful range of motion.  Limited range of motion.  No instability.  Trace effusion.  Mild varus malalignment. Labs: Recent Results (from the past 2160 hour(s))  CBC WITH DIFFERENTIAL     Status: None   Collection Time: 04/01/20 11:58 AM  Result Value Ref Range   WBC 6.5 4.0 - 10.5 K/uL   RBC 4.64 3.87 - 5.11 MIL/uL   Hemoglobin 14.4 12.0 - 15.0 g/dL   HCT 76.2 83.1 - 51.7 %   MCV 94.6 80.0 - 100.0  fL   MCH 31.0 26.0 - 34.0 pg   MCHC 32.8 30.0 - 36.0 g/dL   RDW 61.6 07.3 - 71.0 %   Platelets 184 150 - 400 K/uL   nRBC 0.0 0.0 - 0.2 %   Neutrophils Relative % 55 %   Neutro Abs 3.6 1.7 - 7.7 K/uL   Lymphocytes Relative 35 %   Lymphs Abs 2.3 0.7 - 4.0 K/uL   Monocytes Relative 8 %   Monocytes Absolute 0.5 0.1 - 1.0 K/uL   Eosinophils Relative 1 %   Eosinophils Absolute 0.1 0.0 - 0.5 K/uL   Basophils Relative 1 %   Basophils Absolute 0.0 0.0 - 0.1 K/uL   Immature Granulocytes 0 %   Abs Immature Granulocytes 0.01 0.00 - 0.07 K/uL    Comment: Performed at [facility], [address] [place], [place] [address]  Comprehensive metabolic panel     Status: None   Collection Time: 04/01/20 11:58 AM  Result Value Ref Range   Sodium 139 135 - 145 mmol/L   Potassium 4.7 3.5 - 5.1 mmol/L   Chloride 104 98 - 111 mmol/L   CO2 27 22 - 32 mmol/L   Glucose, Bld 95 70 - 99 mg/dL    Comment: Glucose reference range applies only to samples taken after fasting for at least 8 hours.   BUN 16 6 - 20 mg/dL   Creatinine, Ser 8.54 0.44 - 1.00 mg/dL   Calcium 9.3 8.9 - 62.7 mg/dL   Total Protein 7.7 6.5 - 8.1 g/dL   Albumin 4.4 3.5 - 5.0 g/dL   AST 17 15 - 41 U/L   ALT 17 0 - 44 U/L   Alkaline Phosphatase 68 38 - 126 U/L   Total Bilirubin 0.7 0.3 - 1.2 mg/dL   GFR, Estimated >03 >50 mL/min    Comment: (NOTE) Calculated using the CKD-EPI Creatinine Equation (2021)    Anion gap 8 5 - 15    Comment: Performed at [facility], [address] [place], [place] [address]  Protime-INR     Status: None   Collection Time: 04/01/20 11:58 AM  Result Value Ref Range   Prothrombin Time 13.1 11.4 - 15.2 seconds   INR 1.0 0.8 - 1.2    Comment: (NOTE) INR goal varies based on device and disease states. Performed at [facility], [address] [place], [place] [address]   APTT     Status: None   Collection Time: 04/01/20 11:58 AM  Result Value  Ref Range   aPTT 30 24 - 36 seconds    Comment: Performed at [facility], [address] [place], [place] [address]  Urinalysis, Routine w reflex microscopic Urine, Clean Catch     Status: None   Collection Time: 04/01/20 11:58 AM  Result Value Ref Range   Color, Urine YELLOW YELLOW   APPearance CLEAR CLEAR   Specific Gravity,  Urine 1.016 1.005 - 1.030   pH 7.0 5.0 - 8.0   Glucose, UA NEGATIVE NEGATIVE mg/dL   Hgb urine dipstick NEGATIVE NEGATIVE   Bilirubin Urine NEGATIVE NEGATIVE   Ketones, ur NEGATIVE NEGATIVE mg/dL   Protein, ur NEGATIVE NEGATIVE mg/dL   Nitrite NEGATIVE NEGATIVE   Leukocytes,Ua NEGATIVE NEGATIVE    Comment: Performed at [facility], [address] [place], [place] [address]  Type and screen Order type and screen if day of surgery is less than 15 days from draw of preadmission visit or order morning of surgery if day of surgery is greater than 6 days from preadmission visit.     Status: None   Collection Time: 04/01/20 11:58 AM  Result Value Ref Range   ABO/RH(D) O NEG    Antibody Screen NEG    Sample Expiration 04/15/2020,2359    Extend sample reason      NO TRANSFUSIONS OR PREGNANCY IN THE PAST 3 MONTHS Performed at [facility], [address] [place], [place] [address]   Surgical pcr screen     Status: None   Collection Time: 04/01/20 11:58 AM   Specimen: Nasal Mucosa; Nasal Swab  Result Value Ref Range   MRSA, PCR NEGATIVE NEGATIVE   Staphylococcus aureus NEGATIVE NEGATIVE    Comment: (NOTE) The Xpert SA Assay (FDA approved for NASAL specimens in patients 52 years of age and older), is one component of a comprehensive surveillance program. It is not intended to diagnose infection nor to guide or monitor treatment. Performed at [facility], [address] [place], [place] [address]   SARS CORONAVIRUS 2 (TAT 6-24 HRS) Nasopharyngeal Nasopharyngeal Swab     Status: None    Collection Time: 04/06/20  2:00 PM   Specimen: Nasopharyngeal Swab  Result Value Ref Range   SARS Coronavirus 2 NEGATIVE NEGATIVE    Comment: (NOTE) SARS-CoV-2 target nucleic acids are NOT DETECTED.  The SARS-CoV-2 RNA is generally detectable in upper and lower respiratory specimens during the acute phase of infection. Negative results do not preclude SARS-CoV-2 infection, do not rule out co-infections with other pathogens, and should not be used as the sole basis for treatment or other patient management decisions. Negative results must be combined with clinical observations, patient history, and epidemiological information. The expected result is Negative.  Fact Sheet for Patients: HairSlick.nohttps://www.fda.gov/media/138098/download  Fact Sheet for Healthcare Providers: quierodirigir.comhttps://www.fda.gov/media/138095/download  This test is not yet approved or cleared by the [place] FDA and  has been authorized for detection and/or diagnosis of SARS-CoV-2 by FDA under an Emergency Use Authorization (EUA). This EUA will remain  in effect (meaning this test can be used) for the duration of the COVID-19 declaration under Se ction 564(b)(1) of the Act, 21 U.S.C. section 360bbb-3(b)(1), unless the authorization is terminated or revoked sooner.  Performed at [facility], [address] [place], [place] [address]   ABO/Rh     Status: None   Collection Time: 04/09/20  5:50 AM  Result Value Ref Range   ABO/RH(D)      [place] NEG Performed at [facility], [address] [place], [place] [address]     Estimated body mass index is 30.37 kg/m as calculated from the following:   Height as of 04/01/20: 5' 6.5" (1.689 m).   Weight as of 04/01/20: 86.6 kg.   Imaging Review Plain radiographs demonstrate severe degenerative joint disease of the right knee(s). The overall alignment ismild varus. The  bone quality appears to be fair for age and reported activity  level.      Assessment/Plan:  End stage arthritis, right knee   The patient history, physical examination, clinical judgment of the provider and imaging studies are consistent with end stage degenerative joint disease of the right knee(s) and total knee arthroplasty is deemed medically necessary. The treatment options including medical management, injection therapy arthroscopy and arthroplasty were discussed at length. The risks and benefits of total knee arthroplasty were presented and reviewed. The risks due to aseptic loosening, infection, stiffness, patella tracking problems, thromboembolic complications and other imponderables were discussed. The patient acknowledged the explanation, agreed to proceed with the plan and consent was signed. Patient is being admitted for inpatient treatment for surgery, pain control, PT, OT, prophylactic antibiotics, VTE prophylaxis, progressive ambulation and ADL's and discharge planning. The patient is planning to be discharged home with home health services     Patient's anticipated LOS is less than 2 midnights, meeting these requirements: - Younger than 13 - Lives within 1 hour of care - Has a competent adult at home to recover with post-op recover - NO history of  - Chronic pain requiring opiods  - Diabetes  - Coronary Artery Disease  - Heart failure  - Heart attack  - Stroke  - DVT/VTE  - Cardiac arrhythmia  - Respiratory Failure/COPD  - Renal failure  - Anemia  - Advanced Liver disease

## 2020-04-09 NOTE — Progress Notes (Signed)
Orthopedic Tech Progress Note Patient Details:  Abigail Snyder 1970-06-06 [id number]  Ortho Devices Ortho Device/Splint Location: off cpm   Post Interventions Patient Tolerated: Well Instructions Provided: Care of device   [name] 04/09/2020, 10:37 PM

## 2020-04-09 NOTE — Progress Notes (Signed)
Orthopedic Tech Progress Note Patient Details:  Abigail Snyder 1971/02/13 [id number]  CPM Right Knee CPM Right Knee: On Right Knee Flexion (Degrees): 90 Right Knee Extension (Degrees): 0  Post Interventions Patient Tolerated: Well Instructions Provided: Care of device  [name] 04/09/2020, 7:10 PM

## 2020-04-09 NOTE — Op Note (Signed)
PATIENT ID:      Abigail Snyder  MRN:     [id number] DOB/AGE:    April 27, 1970 / 50 y.o.       OPERATIVE REPORT   DATE OF PROCEDURE:  04/09/2020      PREOPERATIVE DIAGNOSIS:   RIGHT KNEE PRIMARY OSTEOARTHRITIS      Estimated body mass index is 30.37 kg/m as calculated from the following:   Height as of 04/01/20: 5' 6.5" (1.689 m).   Weight as of 04/01/20: 86.6 kg.                                                       POSTOPERATIVE DIAGNOSIS:   Same                                                                  PROCEDURE:  Procedure(s): TOTAL KNEE ARTHROPLASTY Using DepuyAttune RP implants #5 Femur, #5Tibia, 10 mm Attune RP bearing, 38 Patella    SURGEON: Harvie Junior  ASSISTANT:   Devoria Glassing PA-C   (Present and scrubbed throughout the case, critical for assistance with exposure, retraction, instrumentation, and closure.)        ANESTHESIA: spinal, 20cc Exparel, 50cc 0.25% Marcaine EBL: min cc FLUID REPLACEMENT: unk cc crystaloid TOURNIQUET: DRAINS: None TRANEXAMIC ACID: 1gm IV, 2gm topical COMPLICATIONS:  None         INDICATIONS FOR PROCEDURE: The patient has  RIGHT KNEE PRIMARY OSTEOARTHRITIS, mild varus deformities, XR shows bone on bone arthritis, lateral subluxation of tibia. Patient has failed all conservative measures including anti-inflammatory medicines, narcotics, attempts at exercise and weight loss, cortisone injections and viscosupplementation.  Risks and benefits of surgery have been discussed, questions answered.   DESCRIPTION OF PROCEDURE: The patient identified by armband, received  IV antibiotics, in the holding area at [facility]. Patient taken to the operating room, appropriate anesthetic monitors were attached, and spinal anesthesia was  induced. IV Tranexamic acid was given.Tourniquet applied high to the operative thigh. Lateral post and foot positioner applied to the table, the lower extremity was then prepped and draped in usual sterile fashion from  the toes to the tourniquet. Time-out procedure was performed. Devoria Glassing [facility], was present and scrubbed throughout the case, critical for assistance with, positioning, exposure, retraction, instrumentation, and closure.The skin and subcutaneous tissue along the incision was injected with 20 cc of a mixture of Exparel and Marcaine solution, using a 20-gauge by 1-1/2 inch needle. We began the operation, with the knee flexed 130 degrees, by making the anterior midline incision starting at handbreadth above the patella going over the patella 1 cm medial to and 4 cm distal to the tibial tubercle. Small bleeders in the skin and the subcutaneous tissue identified and cauterized. Transverse retinaculum was incised and reflected medially and a medial parapatellar arthrotomy was accomplished. the patella was everted and theprepatellar fat pad resected. The superficial medial collateral ligament was then elevated from anterior to posterior along the proximal flare of the tibia and anterior half of the menisci resected. The knee was hyperflexed exposing bone on bone arthritis. Peripheral and notch osteophytes  as well as the cruciate ligaments were then resected. We continued to work our way around posteriorly along the proximal tibia, and externally rotated the tibia subluxing it out from underneath the femur. A McHale PCL retractor was placed through the notch and a lateral Hohmann retractor placed, and we then entered the proximal tibia in line with the Depuy starter drill in line with the axis of the tibia followed by an intramedullary guide rod and 0-degree posterior slope cutting guide. The tibial cutting guide, 4 degree posterior sloped, was pinned into place allowing resection of 2 mm of bone medially and 10 mm of bone laterally. Satisfied with the tibial resection, we then entered the distal femur 2 mm anterior to the PCL origin with the intramedullary guide rod and applied the distal femoral cutting guide set at 9  mm, with 5 degrees of valgus. This was pinned along the epicondylar axis. At this point, the distal femoral cut was accomplished without difficulty. We then sized for a #5 femoral component and pinned the guide in 3 degrees of external rotation. The chamfer cutting guide was pinned into place. The anterior, posterior, and chamfer cuts were accomplished without difficulty followed by the Attune RP box cutting guide and the box cut. We also removed posterior osteophytes from the posterior femoral condyles. The posterior capsule was injected with Exparel solution. The knee was brought into full extension. We checked our extension gap and fit a 10 mm bearing. Distracting in extension with a lamina spreader,  bleeders in the posterior capsule, Posterior medial and posterior lateral gutter were cauterized.  The transexamic acid-soaked sponge was then placed in the gap of the knee in extension. The knee was flexed 30. The posterior patella cut was accomplished with the 9.5 mm Attune cutting guide, sized for a 59mm dome, and the fixation pegs drilled.The knee was then once again hyperflexed exposing the proximal tibia. We sized for a # 5 tibial base plate, applied the smokestack and the conical reamer followed by the the Delta fin keel punch. We then hammered into place the Attune RP trial femoral component, drilled the lugs, inserted a  10 mm trial bearing, trial patellar button, and took the knee through range of motion from 0-130 degrees. Medial and lateral ligamentous stability was checked. No thumb pressure was required for patellar Tracking. The tourniquet was released 47 min. All trial components were removed, mating surfaces irrigated with pulse lavage, and dried with suction and sponges. 10 cc of the Exparel solution was applied to the cancellus bone of the patella distal femur and proximal tibia.  After waiting 30 seconds, the bony surfaces were again, dried with sponges. A double batch of DePuy HV cement was  mixed and applied to all bony metallic mating surfaces except for the posterior condyles of the femur itself. In order, we hammered into place the tibial tray and removed excess cement, the femoral component and removed excess cement. The final Attune RP bearing was inserted, and the knee brought to full extension with compression. The patellar button was clamped into place, and excess cement removed. The knee was held at 30 flexion with compression, while the cement cured. The wound was irrigated out with normal saline solution pulse lavage. The rest of the Exparel was injected into the parapatellar arthrotomy, subcutaneous tissues, and periosteal tissues. The parapatellar arthrotomy was closed with running #1 Vicryl suture. The subcutaneous tissue with 3-0 undyed Vicryl suture, and the skin with running 3-0 SQ vicryl. An Aquacil and Ace wrap  were applied. The patient was taken to recovery room without difficulty.   Harvie Junior 04/09/2020, 10:05 AM

## 2020-04-09 NOTE — Evaluation (Signed)
Physical Therapy Evaluation Patient Details Name: Cyndie Woodbeck MRN: [id number] DOB: 21-Mar-1970 Today's Date: 04/09/2020   History of Present Illness  Pt s/p R TKR  Clinical Impression  Pt s/p R TKR and presents with decreased R LE strength/ROM and post op pain and nausea limiting functional mobility.  Pt should progress to dc home with family assist and HHPT follow up.    Follow Up Recommendations Home health PT    Equipment Recommendations  None recommended by PT    Recommendations for Other Services       Precautions / Restrictions Precautions Precautions: Fall;Knee Restrictions Weight Bearing Restrictions: No Other Position/Activity Restrictions: WBAT      Mobility  Bed Mobility Overal bed mobility: Needs Assistance Bed Mobility: Supine to Sit;Sit to Supine     Supine to sit: Min assist Sit to supine: Min assist   General bed mobility comments: increased time with assist for R LE management    Transfers                 General transfer comment: NT2* onset nausea and dry heaving  Ambulation/Gait                Stairs            Wheelchair Mobility    Modified Rankin (Stroke Patients Only)       Balance Overall balance assessment: Needs assistance Sitting-balance support: No upper extremity supported;Feet supported Sitting balance-Leahy Scale: Fair                                       Pertinent Vitals/Pain Pain Assessment: Faces Faces Pain Scale: Hurts even more Pain Location: L knee Pain Descriptors / Indicators: Aching;Guarding;Grimacing;Sore Pain Intervention(s): Limited activity within patient's tolerance;Monitored during session;Premedicated before session;Ice applied    Home Living Family/patient expects to be discharged to:: Private residence Living Arrangements: Spouse/significant other Available Help at Discharge: Family Type of Home: House Home Access: Stairs to enter Entrance Stairs-Rails:  Right Entrance Stairs-Number of Steps: 4 Home Layout: One level Home Equipment: Environmental consultant - 2 wheels;Cane - single point;Crutches      Prior Function Level of Independence: Independent               Hand Dominance        Extremity/Trunk Assessment   Upper Extremity Assessment Upper Extremity Assessment: Overall WFL for tasks assessed    Lower Extremity Assessment Lower Extremity Assessment: RLE deficits/detail RLE Deficits / Details: L knee    Cervical / Trunk Assessment Cervical / Trunk Assessment: Normal  Communication   Communication: No difficulties  Cognition Arousal/Alertness: Awake/alert Behavior During Therapy: WFL for tasks assessed/performed Overall Cognitive Status: Within Functional Limits for tasks assessed                                        General Comments      Exercises Total Joint Exercises Ankle Circles/Pumps: AROM;Both;15 reps;Supine Quad Sets: AROM;Both;10 reps;Supine Gluteal Sets: AAROM;Right;5 reps;Supine   Assessment/Plan    PT Assessment Patient needs continued PT services  PT Problem List Decreased strength;Decreased range of motion;Decreased activity tolerance;Decreased mobility;Decreased balance;Decreased knowledge of use of DME;Pain       PT Treatment Interventions DME instruction;Gait training;Stair training;Functional mobility training;Therapeutic activities;Therapeutic exercise;Patient/family education    PT Goals (Current goals can be found  in the Care Plan section)  Acute Rehab PT Goals Patient Stated Goal: Regain IND; HOME ASAP PT Goal Formulation: With patient Time For Goal Achievement: 04/16/20 Potential to Achieve Goals: Good    Frequency 7X/week   Barriers to discharge        Co-evaluation               AM-PAC PT "6 Clicks" Mobility  Outcome Measure Help needed turning from your back to your side while in a flat bed without using bedrails?: A Little Help needed moving from lying on  your back to sitting on the side of a flat bed without using bedrails?: A Little Help needed moving to and from a bed to a chair (including a wheelchair)?: A Lot Help needed standing up from a chair using your arms (e.g., wheelchair or bedside chair)?: A Lot Help needed to walk in hospital room?: A Lot Help needed climbing 3-5 steps with a railing? : A Lot 6 Click Score: 14    End of Session Equipment Utilized During Treatment: Gait belt Activity Tolerance: Patient limited by pain;Other (comment) (nausea) Patient left: in bed;with call bell/phone within reach;with family/visitor present Nurse Communication: Mobility status PT Visit Diagnosis: Difficulty in walking, not elsewhere classified (R26.2);Pain Pain - Right/Left: Right Pain - part of body: Knee    Time: 1610-9604 PT Time Calculation (min) (ACUTE ONLY): 22 min   Charges:   PT Evaluation $PT Eval Low Complexity: 1 Low          Mauro Kaufmann PT Acute Rehabilitation Services Pager [phone] Office [phone]   Maze Corniel 04/09/2020, 2:43 PM

## 2020-04-09 NOTE — Progress Notes (Signed)
Physical Therapy Treatment Patient Details Name: Abigail Snyder MRN: [id number] DOB: 1970/05/17 Today's Date: 04/09/2020    History of Present Illness Pt s/p R TKR    PT Comments    Pt reports feeling better and wishing to re-attempt PT in effort to dc home this date.  Pt progressed as far as standing this session but tolerating limited WB on R LE and with increasing nausea.  Pt assisted back to bed.  RN present observing.  Follow Up Recommendations  Home health PT     Equipment Recommendations  None recommended by PT    Recommendations for Other Services       Precautions / Restrictions Precautions Precautions: Fall;Knee Restrictions Weight Bearing Restrictions: No Other Position/Activity Restrictions: WBAT    Mobility  Bed Mobility Overal bed mobility: Needs Assistance Bed Mobility: Supine to Sit;Sit to Supine     Supine to sit: Min assist Sit to supine: Min assist   General bed mobility comments: increased time with assist for R LE management    Transfers Overall transfer level: Needs assistance Equipment used: Rolling walker (2 wheeled) Transfers: Sit to/from Stand Sit to Stand: From elevated surface;Min assist         General transfer comment: cues for LE management and use of UEs to self assist.  Physical assist to bring wt up and fwd and to balance in standing  Ambulation/Gait Ambulation/Gait assistance: Min assist;Mod assist Gait Distance (Feet): 2 Feet Assistive device: Rolling walker (2 wheeled) Gait Pattern/deviations: Step-to pattern;Decreased step length - right;Decreased step length - left;Shuffle;Trunk flexed Gait velocity: decr   General Gait Details: one step bkwd and side step up side of bed; limited WB on R LE tolerated.  Pt returned to sitting with increasing nausea   Stairs             Wheelchair Mobility    Modified Rankin (Stroke Patients Only)       Balance Overall balance assessment: Needs  assistance Sitting-balance support: No upper extremity supported;Feet supported Sitting balance-Leahy Scale: Fair     Standing balance support: Bilateral upper extremity supported Standing balance-Leahy Scale: Poor                              Cognition Arousal/Alertness: Awake/alert Behavior During Therapy: WFL for tasks assessed/performed Overall Cognitive Status: Within Functional Limits for tasks assessed                                        Exercises Total Joint Exercises Ankle Circles/Pumps: AROM;Both;15 reps;Supine Quad Sets: AROM;Both;10 reps;Supine Gluteal Sets: AAROM;Right;5 reps;Supine    General Comments        Pertinent Vitals/Pain Pain Assessment: Faces Faces Pain Scale: Hurts even more Pain Location: L knee Pain Descriptors / Indicators: Aching;Guarding;Grimacing;Sore Pain Intervention(s): Limited activity within patient's tolerance;Monitored during session;Premedicated before session;Ice applied    Home Living Family/patient expects to be discharged to:: Private residence Living Arrangements: Spouse/significant other Available Help at Discharge: Family Type of Home: House Home Access: Stairs to enter Entrance Stairs-Rails: Right Home Layout: One level Home Equipment: Environmental consultant - 2 wheels;Cane - single point;Crutches      Prior Function Level of Independence: Independent          PT Goals (current goals can now be found in the care plan section) Acute Rehab PT Goals Patient Stated Goal: Regain IND; HOME  ASAP PT Goal Formulation: With patient Time For Goal Achievement: 04/16/20 Potential to Achieve Goals: Good Progress towards PT goals: Progressing toward goals    Frequency    7X/week      PT Plan Current plan remains appropriate    Co-evaluation              AM-PAC PT "6 Clicks" Mobility   Outcome Measure  Help needed turning from your back to your side while in a flat bed without using bedrails?: A  Little Help needed moving from lying on your back to sitting on the side of a flat bed without using bedrails?: A Little Help needed moving to and from a bed to a chair (including a wheelchair)?: A Lot Help needed standing up from a chair using your arms (e.g., wheelchair or bedside chair)?: A Little Help needed to walk in hospital room?: A Lot Help needed climbing 3-5 steps with a railing? : A Lot 6 Click Score: 15    End of Session Equipment Utilized During Treatment: Gait belt Activity Tolerance: Patient limited by pain;Other (comment) Patient left: in bed;with call bell/phone within reach;with family/visitor present Nurse Communication: Mobility status PT Visit Diagnosis: Difficulty in walking, not elsewhere classified (R26.2);Pain Pain - Right/Left: Right Pain - part of body: Knee     Time: 1540-1555 PT Time Calculation (min) (ACUTE ONLY): 15 min  Charges:  $Therapeutic Activity: 8-22 mins                     Mauro Kaufmann PT Acute Rehabilitation Services Pager [phone] Office [phone]    Karas Pickerill 04/09/2020, 4:10 PM

## 2020-04-09 NOTE — Discharge Instructions (Signed)

## 2020-04-09 NOTE — Anesthesia Procedure Notes (Signed)
Anesthesia Regional Block: Adductor canal block   Pre-Anesthetic Checklist: ,, timeout performed, Correct Patient, Correct Site, Correct Laterality, Correct Procedure, Correct Position, site marked, Risks and benefits discussed,  Surgical consent,  Pre-op evaluation,  At surgeon's request and post-op pain management  Laterality: Right  Prep: chloraprep       Needles:  Injection technique: Single-shot  Needle Type: Echogenic Needle     Needle Length: 9cm  Needle Gauge: 21     Additional Needles:   Procedures:,,,, ultrasound used (permanent image in chart),,,,  Narrative:  Start time: 04/09/2020 6:44 AM End time: 04/09/2020 6:49 AM Injection made incrementally with aspirations every 5 mL.  Performed by: Personally  Anesthesiologist: Marcene Duos, MD

## 2020-04-09 NOTE — Anesthesia Postprocedure Evaluation (Signed)
Anesthesia Post Note  Patient: Abigail Snyder  Procedure(s) Performed: TOTAL KNEE ARTHROPLASTY (Right Knee)     Patient location during evaluation: PACU Anesthesia Type: General Level of consciousness: awake and alert Pain management: pain level controlled Vital Signs Assessment: post-procedure vital signs reviewed and stable Respiratory status: spontaneous breathing, nonlabored ventilation, respiratory function stable and patient connected to nasal cannula oxygen Cardiovascular status: blood pressure returned to baseline and stable Postop Assessment: no apparent nausea or vomiting Anesthetic complications: no   No complications documented.  Last Vitals:  Vitals:   04/09/20 1136 04/09/20 1208  BP:  136/66  Pulse: 65 64  Resp: 10 12  Temp:    SpO2: 100% 96%    Last Pain:  Vitals:   04/09/20 1136  TempSrc:   PainSc: 3                  Kennieth Rad

## 2020-04-09 NOTE — Anesthesia Procedure Notes (Signed)
Procedure Name: LMA Insertion Date/Time: 04/09/2020 7:35 AM Performed by: Theodosia Quay, CRNA Pre-anesthesia Checklist: Patient identified, Emergency Drugs available, Suction available and Patient being monitored Patient Re-evaluated:Patient Re-evaluated prior to induction Oxygen Delivery Method: Circle System Utilized Preoxygenation: Pre-oxygenation with 100% oxygen Induction Type: IV induction Ventilation: Mask ventilation without difficulty LMA: LMA inserted LMA Size: 4.0 Number of attempts: 1 Airway Equipment and Method: Bite block Placement Confirmation: positive ETCO2 Tube secured with: Tape Dental Injury: Teeth and Oropharynx as per pre-operative assessment

## 2020-04-09 NOTE — Transfer of Care (Signed)
Immediate Anesthesia Transfer of Care Note  Patient: Abigail Snyder  Procedure(s) Performed: Procedure(s): TOTAL KNEE ARTHROPLASTY (Right)  Patient Location: PACU  Anesthesia Type:GA combined with regional for post-op pain  Level of Consciousness:  sedated, patient cooperative and responds to stimulation  Airway & Oxygen Therapy:Patient Spontanous Breathing and Patient connected to face mask oxgen  Post-op Assessment:  Report given to PACU RN and Post -op Vital signs reviewed and stable  Post vital signs:  Reviewed and stable  Last Vitals:  Vitals:   04/09/20 0620  BP: 116/69  Pulse: 64  Resp: 16  Temp: 36.7 C  SpO2: 100%    Complications: No apparent anesthesia complications

## 2020-04-10 ENCOUNTER — Other Ambulatory Visit: Payer: Self-pay

## 2020-04-10 DIAGNOSIS — M1711 Unilateral primary osteoarthritis, right knee: Secondary | ICD-10-CM | POA: Diagnosis not present

## 2020-04-10 LAB — CBC
HCT: 33.3 % — ABNORMAL LOW (ref 36.0–46.0)
Hemoglobin: 10.8 g/dL — ABNORMAL LOW (ref 12.0–15.0)
MCH: 30.9 pg (ref 26.0–34.0)
MCHC: 32.4 g/dL (ref 30.0–36.0)
MCV: 95.1 fL (ref 80.0–100.0)
Platelets: 145 10*3/uL — ABNORMAL LOW (ref 150–400)
RBC: 3.5 MIL/uL — ABNORMAL LOW (ref 3.87–5.11)
RDW: 12.7 % (ref 11.5–15.5)
WBC: 13.5 10*3/uL — ABNORMAL HIGH (ref 4.0–10.5)
nRBC: 0 % (ref 0.0–0.2)

## 2020-04-10 MED ORDER — ONDANSETRON 4 MG PO TBDP: 4.0000 mg | ORAL_TABLET | Freq: Three times a day (TID) | ORAL | Status: DC | PRN

## 2020-04-10 MED ORDER — HYDROCODONE-ACETAMINOPHEN 10-325 MG PO TABS
1.0000 | ORAL_TABLET | ORAL | Status: DC | PRN
  Administered 2020-04-10 (×2): 1 via ORAL
  Filled 2020-04-10 (×2): qty 1

## 2020-04-10 MED ORDER — HYDROCODONE-ACETAMINOPHEN 10-325 MG PO TABS
1.0000 | ORAL_TABLET | ORAL | 0 refills | Status: DC | PRN
Start: 2020-04-10 — End: 2021-11-08

## 2020-04-10 MED ORDER — ASPIRIN 325 MG PO TBEC: 325.0000 mg | DELAYED_RELEASE_TABLET | Freq: Two times a day (BID) | ORAL | 0 refills | Status: DC

## 2020-04-10 MED ORDER — ONDANSETRON 4 MG PO TBDP: 4.0000 mg | ORAL_TABLET | Freq: Three times a day (TID) | ORAL | 0 refills | Status: DC | PRN

## 2020-04-10 NOTE — Progress Notes (Addendum)
Physical Therapy Treatment Patient Details Name: Abigail Snyder MRN: [id number] DOB: Feb 23, 1970 Today's Date: 04/10/2020    History of Present Illness Pt s/p R TKR    PT Comments    Pt with marked improvement in activity tolerance following change in pain meds and improved pain control.  Pt up to ambulate limited distance in hall, negotiated stairs and performed HEP with assist - written instruction provided and reviewed.  Spouse present for session.  Multiple questions asked and answered.   Follow Up Recommendations  Home health PT     Equipment Recommendations  None recommended by PT    Recommendations for Other Services       Precautions / Restrictions Precautions Precautions: Fall;Knee Restrictions Weight Bearing Restrictions: No Other Position/Activity Restrictions: WBAT    Mobility  Bed Mobility               General bed mobility comments: Pt up in chair and requests back to same    Transfers Overall transfer level: Needs assistance Equipment used: Rolling walker (2 wheeled) Transfers: Sit to/from Stand Sit to Stand: Min guard         General transfer comment: cues for LE management and use of UEs to self assist.  Ambulation/Gait Ambulation/Gait assistance: Min assist;Min guard Gait Distance (Feet): 50 Feet Assistive device: Rolling walker (2 wheeled) Gait Pattern/deviations: Step-to pattern;Decreased step length - right;Decreased step length - left;Shuffle;Trunk flexed Gait velocity: decr   General Gait Details: Increased time, cues for sequence, posture, position from RW.  Marked improvement in WB tolerance on R LE noted   Stairs             Wheelchair Mobility    Modified Rankin (Stroke Patients Only)       Balance Overall balance assessment: Needs assistance Sitting-balance support: No upper extremity supported;Feet supported Sitting balance-Leahy Scale: Fair     Standing balance support: Bilateral upper extremity  supported Standing balance-Leahy Scale: Fair                              Cognition Arousal/Alertness: Awake/alert Behavior During Therapy: WFL for tasks assessed/performed Overall Cognitive Status: Within Functional Limits for tasks assessed                                        Exercises Total Joint Exercises Ankle Circles/Pumps: AROM;Both;15 reps;Supine Quad Sets: AROM;Both;10 reps;Supine Heel Slides: AAROM;10 reps;Supine;Right Straight Leg Raises: AAROM;10 reps;Supine;Right Goniometric ROM: AAROM -5 - 30    General Comments        Pertinent Vitals/Pain Pain Assessment: 0-10 Pain Score: 5  Pain Location: R knee Pain Descriptors / Indicators: Aching;Sore Pain Intervention(s): Limited activity within patient's tolerance;Monitored during session;Premedicated before session;Ice applied    Home Living                      Prior Function            PT Goals (current goals can now be found in the care plan section) Acute Rehab PT Goals Patient Stated Goal: Regain IND; HOME ASAP PT Goal Formulation: With patient Time For Goal Achievement: 04/16/20 Potential to Achieve Goals: Good Progress towards PT goals: Progressing toward goals    Frequency    7X/week      PT Plan Current plan remains appropriate    Co-evaluation  AM-PAC PT "6 Clicks" Mobility   Outcome Measure  Help needed turning from your back to your side while in a flat bed without using bedrails?: A Little Help needed moving from lying on your back to sitting on the side of a flat bed without using bedrails?: A Little Help needed moving to and from a bed to a chair (including a wheelchair)?: A Little Help needed standing up from a chair using your arms (e.g., wheelchair or bedside chair)?: A Little Help needed to walk in hospital room?: A Little Help needed climbing 3-5 steps with a railing? : A Little 6 Click Score: 18    End of Session  Equipment Utilized During Treatment: Gait belt Activity Tolerance: Patient tolerated treatment well Patient left: in chair;with call bell/phone within reach;with chair alarm set;with family/visitor present Nurse Communication: Mobility status PT Visit Diagnosis: Difficulty in walking, not elsewhere classified (R26.2);Pain Pain - Right/Left: Right Pain - part of body: Knee     Time: 1330-1410 PT Time Calculation (min) (ACUTE ONLY): 40 min  Charges:  $Gait Training: 8-22 mins $Therapeutic Exercise: 8-22 mins $Therapeutic Activity: 8-22 mins                     Mauro Kaufmann PT Acute Rehabilitation Services Pager [phone] Office [phone]     Fortunata Betty 04/10/2020, 4:35 PM

## 2020-04-10 NOTE — TOC Progression Note (Signed)
Transition of Care [facility]) - Progression Note    Patient Details  Name: Abigail Snyder MRN: [id number] Date of Birth: 1970-07-12  Transition of Care [facility]) CM/SW Contact  Armanda Heritage, RN Phone Number: 04/10/2020, 1:18 PM  Clinical Narrative:    CM spoke with patient who reports she has rolling walker and 3in1.  [facility] to provide HHPT services, all requested referral information faxed to agency.     Expected Discharge Plan: Home w Home Health Services Barriers to Discharge: No Barriers Identified  Expected Discharge Plan and Services Expected Discharge Plan: Home w Home Health Services       Living arrangements for the past 2 months: Single Family Home Expected Discharge Date: 04/10/20               DME Arranged: N/A         HH Arranged: PT HH Agency: Other - See comment ([facility])         Social Determinants of Health (SDOH) Interventions    Readmission Risk Interventions No flowsheet data found.

## 2020-04-10 NOTE — Progress Notes (Signed)
    Patient doing well  PO day 1 S/P R TKA per Dr Luiz Blare team, doing OK, awaiting PT for ambulation, has used CPM and pain moderately well controlled, does not find benefit with Percocet, will switch to hydrocodone as she reports better relief with this in the past. Otherwise pt doing well, eating and drinking and resting well in bed.    Physical Exam: Vitals:   04/10/20 0155 04/10/20 0629  BP: 132/70 114/67  Pulse: 83 89  Resp: 15 16  Temp: 98.3 F (36.8 C) 98.1 F (36.7 C)  SpO2: 98% 98%    Dressing in place, CDI, distal compartments soft, 2+ DPP symmetric  NVI  POD #1 s/p R TKA, awaiting PT clearance and ambulation to proceed home   - up with PT/OT, encourage ambulation - Hydrocodone for pain, Robaxin for muscle spasms - ASA BID for DVT prophylaxis  - likely d/c home today with f/u in 2 weeks

## 2020-04-10 NOTE — Progress Notes (Signed)
Physical Therapy Treatment Patient Details Name: Abigail Snyder MRN: [id number] DOB: [birth year] Today's Date: 04/10/2020    History of Present Illness Pt s/p R TKR    PT Comments     Pt is very cooperative but requiring increased time for all tasks and progressing very slowly 2* ongoing pain and nausea. Pt tolerating ltd therex program this am and ambulated limited distance tolerating min WB on R LE  Follow Up Recommendations  Home health PT     Equipment Recommendations  None recommended by PT    Recommendations for Other Services       Precautions / Restrictions Precautions Precautions: Fall;Knee Restrictions Weight Bearing Restrictions: No Other Position/Activity Restrictions: WBAT    Mobility  Bed Mobility Overal bed mobility: Needs Assistance Bed Mobility: Supine to Sit     Supine to sit: Min assist     General bed mobility comments: increased time with assist for R LE management    Transfers Overall transfer level: Needs assistance Equipment used: Rolling walker (2 wheeled) Transfers: Sit to/from Stand Sit to Stand: From elevated surface;Min assist         General transfer comment: cues for LE management and use of UEs to self assist.  Physical assist to bring wt up and fwd and to balance in standing  Ambulation/Gait Ambulation/Gait assistance: Min assist;Mod assist Gait Distance (Feet): 7 Feet Assistive device: Rolling walker (2 wheeled) Gait Pattern/deviations: Step-to pattern;Decreased step length - right;Decreased step length - left;Shuffle;Trunk flexed Gait velocity: decr   General Gait Details: Increased time, cues for sequence, posture, position from RW, increased heel contact; pt tolerating min WB on R and distance limited by ongoing pain and increasing nausea   Stairs             Wheelchair Mobility    Modified Rankin (Stroke Patients Only)       Balance Overall balance assessment: Needs assistance Sitting-balance  support: No upper extremity supported;Feet supported Sitting balance-Leahy Scale: Fair     Standing balance support: Bilateral upper extremity supported Standing balance-Leahy Scale: Poor                              Cognition Arousal/Alertness: Awake/alert Behavior During Therapy: WFL for tasks assessed/performed Overall Cognitive Status: Within Functional Limits for tasks assessed                                        Exercises Total Joint Exercises Ankle Circles/Pumps: AROM;Both;15 reps;Supine Quad Sets: AROM;Both;10 reps;Supine Heel Slides: AAROM;10 reps;Supine;Right Straight Leg Raises: AAROM;10 reps;Supine;Right Goniometric ROM: AAROM -5-15 with ++ muscle guarding 2* severe pain    General Comments        Pertinent Vitals/Pain Pain Assessment: Faces Faces Pain Scale: Hurts whole lot Pain Location: R knee Pain Descriptors / Indicators: Aching;Guarding;Grimacing;Sore Pain Intervention(s): Limited activity within patient's tolerance;Monitored during session;Premedicated before session;Ice applied    Home Living                      Prior Function            PT Goals (current goals can now be found in the care plan section) Acute Rehab PT Goals Patient Stated Goal: Regain IND; HOME ASAP PT Goal Formulation: With patient Time For Goal Achievement: 04/16/20 Potential to Achieve Goals: Good Progress towards PT goals: Progressing toward  goals    Frequency    7X/week      PT Plan Current plan remains appropriate    Co-evaluation              AM-PAC PT "6 Clicks" Mobility   Outcome Measure  Help needed turning from your back to your side while in a flat bed without using bedrails?: A Little Help needed moving from lying on your back to sitting on the side of a flat bed without using bedrails?: A Little Help needed moving to and from a bed to a chair (including a wheelchair)?: A Lot Help needed standing up from a  chair using your arms (e.g., wheelchair or bedside chair)?: A Little Help needed to walk in hospital room?: A Little Help needed climbing 3-5 steps with a railing? : A Lot 6 Click Score: 16    End of Session Equipment Utilized During Treatment: Gait belt Activity Tolerance: Patient limited by pain;Other (comment) (nausea) Patient left: in chair;with call bell/phone within reach;with chair alarm set;with family/visitor present Nurse Communication: Mobility status PT Visit Diagnosis: Difficulty in walking, not elsewhere classified (R26.2);Pain Pain - Right/Left: Right Pain - part of body: Knee     Time: 1696-7893 PT Time Calculation (min) (ACUTE ONLY): 37 min  Charges:  $Gait Training: 8-22 mins $Therapeutic Exercise: 8-22 mins                     Mauro Kaufmann PT Acute Rehabilitation Services Pager [phone] Office [phone]    Daxson Reffett 04/10/2020, 12:51 PM

## 2020-04-13 ENCOUNTER — Encounter (HOSPITAL_COMMUNITY): Payer: Self-pay | Admitting: Orthopedic Surgery

## 2020-04-20 NOTE — Discharge Summary (Signed)
Physician Discharge Summary  Patient ID: Abigail Snyder MRN: [id number] DOB/AGE: March 19, 1970 50 y.o.  Admit date: 04/09/2020 Discharge date: 04/20/2020  Admission Diagnoses:  Discharge Diagnoses:  Active Problems:   Status post total knee replacement, right   Discharged Condition: good  Hospital Course unremarkable course following right total knee replacement  Consults: None  Significant Diagnostic Studies: n/a  Treatments: IV hydration, analgesia: acetaminophen and Dilaudid and therapies: PT  Discharge Exam: Blood pressure 138/72, pulse 93, temperature 98.2 F (36.8 C), resp. rate 16, height  (1.676 m), SpO2 99 %. General appearance: alert, cooperative, appears stated age and no distress Head: Normocephalic, without obvious abnormality, atraumatic Eyes: negative findings: conjunctivae and sclerae normal and corneas clear Neck: supple, symmetrical, trachea midline Extremities: extremities normal, atraumatic, no cyanosis or edema Pulses: 2+ and symmetric Skin: Skin color, texture, turgor normal. No rashes or lesions or normal Incision/Wound: C/D/I  Disposition: Discharge disposition: 01-Home or Self Care       Discharge Instructions    Call MD for:  difficulty breathing, headache or visual disturbances   Complete by: As directed    Call MD for:  difficulty breathing, headache or visual disturbances   Complete by: As directed    Call MD for:  persistant nausea and vomiting   Complete by: As directed    Call MD for:  persistant nausea and vomiting   Complete by: As directed    Call MD for:  severe uncontrolled pain   Complete by: As directed    Call MD for:  severe uncontrolled pain   Complete by: As directed    Call MD for:  temperature >100.4   Complete by: As directed    Call MD for:  temperature >100.4   Complete by: As directed    Diet - low sodium heart healthy   Complete by: As directed    Discharge instructions   Complete by: As directed     INSTRUCTIONS AFTER JOINT REPLACEMENT   Remove items at home which could result in a fall. This includes throw rugs or furniture in walking pathways ICE to the affected joint every three hours while awake for 30 minutes at a time, for at least the first 3-5 days, and then as needed for pain and swelling.  Continue to use ice for pain and swelling. You may notice swelling that will progress down to the foot and ankle.  This is normal after surgery.  Elevate your leg when you are not up walking on it.   Continue to use the breathing machine you got in the hospital (incentive spirometer) which will help keep your temperature down.  It is common for your temperature to cycle up and down following surgery, especially at night when you are not up moving around and exerting yourself.  The breathing machine keeps your lungs expanded and your temperature down.   DIET:  As you were doing prior to hospitalization, we recommend a well-balanced diet.  DRESSING / WOUND CARE / SHOWERING  Keep the surgical dressing until follow up.  The dressing is water proof, so you can shower without any extra covering.  IF THE DRESSING FALLS OFF or the wound gets wet inside, change the dressing with sterile gauze.  Please use good hand washing techniques before changing the dressing.  Do not use any lotions or creams on the incision until instructed by your surgeon.    ACTIVITY  Increase activity slowly as tolerated, but follow the weight bearing instructions below.   No  driving for 6 weeks or until further direction given by your physician.  You cannot drive while taking narcotics.  No lifting or carrying greater than 10 lbs. until further directed by your surgeon. Avoid periods of inactivity such as sitting longer than an hour when not asleep. This helps prevent blood clots.  You may return to work once you are authorized by your doctor.     WEIGHT BEARING   Weight bearing as tolerated with assist device (walker, cane,  etc) as directed, use it as long as suggested by your surgeon or therapist, typically at least 4-6 weeks.   EXERCISES  Results after joint replacement surgery are often greatly improved when you follow the exercise, range of motion and muscle strengthening exercises prescribed by your doctor. Safety measures are also important to protect the joint from further injury. Any time any of these exercises cause you to have increased pain or swelling, decrease what you are doing until you are comfortable again and then slowly increase them. If you have problems or questions, call your caregiver or physical therapist for advice.   Rehabilitation is important following a joint replacement. After just a few days of immobilization, the muscles of the leg can become weakened and shrink (atrophy).  These exercises are designed to build up the tone and strength of the thigh and leg muscles and to improve motion. Often times heat used for twenty to thirty minutes before working out will loosen up your tissues and help with improving the range of motion but do not use heat for the first two weeks following surgery (sometimes heat can increase post-operative swelling).   These exercises can be done on a training (exercise) mat, on the floor, on a table or on a bed. Use whatever works the best and is most comfortable for you.    Use music or television while you are exercising so that the exercises are a pleasant break in your day. This will make your life better with the exercises acting as a break in your routine that you can look forward to.   Perform all exercises about fifteen times, three times per day or as directed.  You should exercise both the operative leg and the other leg as well.  Exercises include:   Quad Sets - Tighten up the muscle on the front of the thigh (Quad) and hold for 5-10 seconds.   Straight Leg Raises - With your knee straight (if you were given a brace, keep it on), lift the leg to 60  degrees, hold for 3 seconds, and slowly lower the leg.  Perform this exercise against resistance later as your leg gets stronger.  Leg Slides: Lying on your back, slowly slide your foot toward your buttocks, bending your knee up off the floor (only go as far as is comfortable). Then slowly slide your foot back down until your leg is flat on the floor again.  Angel Wings: Lying on your back spread your legs to the side as far apart as you can without causing discomfort.  Hamstring Strength:  Lying on your back, push your heel against the floor with your leg straight by tightening up the muscles of your buttocks.  Repeat, but this time bend your knee to a comfortable angle, and push your heel against the floor.  You may put a pillow under the heel to make it more comfortable if necessary.   A rehabilitation program following joint replacement surgery can speed recovery and prevent re-injury in the  future due to weakened muscles. Contact your doctor or a physical therapist for more information on knee rehabilitation.    CONSTIPATION  Constipation is defined medically as fewer than three stools per week and severe constipation as less than one stool per week.  Even if you have a regular bowel pattern at home, your normal regimen is likely to be disrupted due to multiple reasons following surgery.  Combination of anesthesia, postoperative narcotics, change in appetite and fluid intake all can affect your bowels.   YOU MUST use at least one of the following options; they are listed in order of increasing strength to get the job done.  They are all available over the counter, and you may need to use some, POSSIBLY even all of these options:    Drink plenty of fluids (prune juice may be helpful) and high fiber foods Colace 100 mg by mouth twice a day  Senokot for constipation as directed and as needed Dulcolax (bisacodyl), take with full glass of water  Miralax (polyethylene glycol) once or twice a day as  needed.  If you have tried all these things and are unable to have a bowel movement in the first 3-4 days after surgery call either your surgeon or your primary doctor.    If you experience loose stools or diarrhea, hold the medications until you stool forms back up.  If your symptoms do not get better within 1 week or if they get worse, check with your doctor.  If you experience "the worst abdominal pain ever" or develop nausea or vomiting, please contact the office immediately for further recommendations for treatment.   ITCHING:  If you experience itching with your medications, try taking only a single pain pill, or even half a pain pill at a time.  You can also use Benadryl over the counter for itching or also to help with sleep.   TED HOSE STOCKINGS:  Use stockings on both legs until for at least 2 weeks or as directed by physician office. They may be removed at night for sleeping.  MEDICATIONS:  See your medication summary on the "After Visit Summary" that nursing will review with you.  You may have some home medications which will be placed on hold until you complete the course of blood thinner medication.  It is important for you to complete the blood thinner medication as prescribed.  PRECAUTIONS:  If you experience chest pain or shortness of breath - call 911 immediately for transfer to the hospital emergency department.   If you develop a fever greater that 101 F, purulent drainage from wound, increased redness or drainage from wound, foul odor from the wound/dressing, or calf pain - CONTACT YOUR SURGEON.                                                   FOLLOW-UP APPOINTMENTS:  If you do not already have a post-op appointment, please call the office for an appointment to be seen by your surgeon.  Guidelines for how soon to be seen are listed in your "After Visit Summary", but are typically between 1-4 weeks after surgery.  OTHER INSTRUCTIONS:   Knee Replacement:  Do not place pillow  under knee, focus on keeping the knee straight while resting. CPM instructions: 0-90 degrees, 2 hours in the morning, 2 hours in the afternoon, and  2 hours in the evening. Place foam block, curve side up under heel at all times except when in CPM or when walking.  DO NOT modify, tear, cut, or change the foam block in any way.   DENTAL ANTIBIOTICS:  In most cases prophylactic antibiotics for Dental procdeures after total joint surgery are not necessary.  Exceptions are as follows:  1. History of prior total joint infection  2. Severely immunocompromised (Organ Transplant, cancer chemotherapy, Rheumatoid biologic meds such as Humera)  3. Poorly controlled diabetes (A1C &gt; 8.0, blood glucose over 200)  If you have one of these conditions, contact your surgeon for an antibiotic prescription, prior to your dental procedure.   MAKE SURE YOU:  Understand these instructions.  Get help right away if you are not doing well or get worse.    Thank you for letting us be a part of your medical care team.  It is a privilege we respect greatly.  We hope these instructions will help you stay on track for a fast and full recovery!   Discharge instructions   Complete by: As directed    INSTRUCTIONS AFTER JOINT REPLACEMENT   Remove items at home which could result in a fall. This includes throw rugs or furniture in walking pathways ICE to the affected joint every three hours while awake for 30 minutes at a time, for at least the first 3-5 days, and then as needed for pain and swelling.  Continue to use ice for pain and swelling. You may notice swelling that will progress down to the foot and ankle.  This is normal after surgery.  Elevate your leg when you are not up walking on it.   Continue to use the breathing machine you got in the hospital (incentive spirometer) which will help keep your temperature down.  It is common for your temperature to cycle up and down following surgery, especially at night  when you are not up moving around and exerting yourself.  The breathing machine keeps your lungs expanded and your temperature down.   DIET:  As you were doing prior to hospitalization, we recommend a well-balanced diet.  DRESSING / WOUND CARE / SHOWERING  Keep the surgical dressing until follow up.  The dressing is water proof, so you can shower without any extra covering.  IF THE DRESSING FALLS OFF or the wound gets wet inside, change the dressing with sterile gauze.  Please use good hand washing techniques before changing the dressing.  Do not use any lotions or creams on the incision until instructed by your surgeon.    ACTIVITY  Increase activity slowly as tolerated, but follow the weight bearing instructions below.   No driving for 6 weeks or until further direction given by your physician.  You cannot drive while taking narcotics.  No lifting or carrying greater than 10 lbs. until further directed by your surgeon. Avoid periods of inactivity such as sitting longer than an hour when not asleep. This helps prevent blood clots.  You may return to work once you are authorized by your doctor.     WEIGHT BEARING   Weight bearing as tolerated with assist device (walker, cane, etc) as directed, use it as long as suggested by your surgeon or therapist, typically at least 4-6 weeks.   EXERCISES  Results after joint replacement surgery are often greatly improved when you follow the exercise, range of motion and muscle strengthening exercises prescribed by your doctor. Safety measures are also important to protect the  joint from further injury. Any time any of these exercises cause you to have increased pain or swelling, decrease what you are doing until you are comfortable again and then slowly increase them. If you have problems or questions, call your caregiver or physical therapist for advice.   Rehabilitation is important following a joint replacement. After just a few days of  immobilization, the muscles of the leg can become weakened and shrink (atrophy).  These exercises are designed to build up the tone and strength of the thigh and leg muscles and to improve motion. Often times heat used for twenty to thirty minutes before working out will loosen up your tissues and help with improving the range of motion but do not use heat for the first two weeks following surgery (sometimes heat can increase post-operative swelling).   These exercises can be done on a training (exercise) mat, on the floor, on a table or on a bed. Use whatever works the best and is most comfortable for you.    Use music or television while you are exercising so that the exercises are a pleasant break in your day. This will make your life better with the exercises acting as a break in your routine that you can look forward to.   Perform all exercises about fifteen times, three times per day or as directed.  You should exercise both the operative leg and the other leg as well.  Exercises include:   Quad Sets - Tighten up the muscle on the front of the thigh (Quad) and hold for 5-10 seconds.   Straight Leg Raises - With your knee straight (if you were given a brace, keep it on), lift the leg to 60 degrees, hold for 3 seconds, and slowly lower the leg.  Perform this exercise against resistance later as your leg gets stronger.  Leg Slides: Lying on your back, slowly slide your foot toward your buttocks, bending your knee up off the floor (only go as far as is comfortable). Then slowly slide your foot back down until your leg is flat on the floor again.  Angel Wings: Lying on your back spread your legs to the side as far apart as you can without causing discomfort.  Hamstring Strength:  Lying on your back, push your heel against the floor with your leg straight by tightening up the muscles of your buttocks.  Repeat, but this time bend your knee to a comfortable angle, and push your heel against the floor.  You  may put a pillow under the heel to make it more comfortable if necessary.   A rehabilitation program following joint replacement surgery can speed recovery and prevent re-injury in the future due to weakened muscles. Contact your doctor or a physical therapist for more information on knee rehabilitation.    CONSTIPATION  Constipation is defined medically as fewer than three stools per week and severe constipation as less than one stool per week.  Even if you have a regular bowel pattern at home, your normal regimen is likely to be disrupted due to multiple reasons following surgery.  Combination of anesthesia, postoperative narcotics, change in appetite and fluid intake all can affect your bowels.   YOU MUST use at least one of the following options; they are listed in order of increasing strength to get the job done.  They are all available over the counter, and you may need to use some, POSSIBLY even all of these options:    Drink plenty of fluids (  prune juice may be helpful) and high fiber foods Colace 100 mg by mouth twice a day  Senokot for constipation as directed and as needed Dulcolax (bisacodyl), take with full glass of water  Miralax (polyethylene glycol) once or twice a day as needed.  If you have tried all these things and are unable to have a bowel movement in the first 3-4 days after surgery call either your surgeon or your primary doctor.    If you experience loose stools or diarrhea, hold the medications until you stool forms back up.  If your symptoms do not get better within 1 week or if they get worse, check with your doctor.  If you experience "the worst abdominal pain ever" or develop nausea or vomiting, please contact the office immediately for further recommendations for treatment.   ITCHING:  If you experience itching with your medications, try taking only a single pain pill, or even half a pain pill at a time.  You can also use Benadryl over the counter for itching or  also to help with sleep.   TED HOSE STOCKINGS:  Use stockings on both legs until for at least 2 weeks or as directed by physician office. They may be removed at night for sleeping.  MEDICATIONS:  See your medication summary on the "After Visit Summary" that nursing will review with you.  You may have some home medications which will be placed on hold until you complete the course of blood thinner medication.  It is important for you to complete the blood thinner medication as prescribed.  PRECAUTIONS:  If you experience chest pain or shortness of breath - call 911 immediately for transfer to the hospital emergency department.   If you develop a fever greater that 101 F, purulent drainage from wound, increased redness or drainage from wound, foul odor from the wound/dressing, or calf pain - CONTACT YOUR SURGEON.                                                   FOLLOW-UP APPOINTMENTS:  If you do not already have a post-op appointment, please call the office for an appointment to be seen by your surgeon.  Guidelines for how soon to be seen are listed in your "After Visit Summary", but are typically between 1-4 weeks after surgery.  OTHER INSTRUCTIONS:   Knee Replacement:  Do not place pillow under knee, focus on keeping the knee straight while resting. CPM instructions: 0-90 degrees, 2 hours in the morning, 2 hours in the afternoon, and 2 hours in the evening. Place foam block, curve side up under heel at all times except when in CPM or when walking.  DO NOT modify, tear, cut, or change the foam block in any way.   DENTAL ANTIBIOTICS:  In most cases prophylactic antibiotics for Dental procdeures after total joint surgery are not necessary.  Exceptions are as follows:  1. History of prior total joint infection  2. Severely immunocompromised (Organ Transplant, cancer chemotherapy, Rheumatoid biologic meds such as Humera)  3. Poorly controlled diabetes (A1C &gt; 8.0, blood glucose over  200)  If you have one of these conditions, contact your surgeon for an antibiotic prescription, prior to your dental procedure.   MAKE SURE YOU:  Understand these instructions.  Get help right away if you are not doing well or get worse.  Thank you for letting us be a part of your medical care team.  It is a privilege we respect greatly.  We hope these instructions will help you stay on track for a fast and full recovery!   Driving Restrictions   Complete by: As directed    No driving until cleared by your surgeon   Increase activity slowly   Complete by: As directed    Increase activity slowly   Complete by: As directed    Leave dressing on - Keep it clean, dry, and intact until clinic visit   Complete by: As directed    Leave dressing on - Keep it clean, dry, and intact until clinic visit   Complete by: As directed    Lifting restrictions   Complete by: As directed    No lifting until cleared by your surgeon     Allergies as of 04/10/2020      Reactions   Bee Venom Anaphylaxis      Medication List    TAKE these medications   aspirin 325 MG EC tablet Take 1 tablet (325 mg total) by mouth 2 (two) times daily after a meal.   diclofenac 75 MG EC tablet Commonly known as: VOLTAREN Take 75 mg by mouth 2 (two) times daily.   HYDROcodone-acetaminophen 10-325 MG tablet Commonly known as: NORCO Take 1-2 tablets by mouth every 4 (four) hours as needed for moderate pain or severe pain.   multivitamin with minerals tablet Take 1 tablet by mouth daily.   ondansetron 4 MG disintegrating tablet Commonly known as: ZOFRAN-ODT Take 1 tablet (4 mg total) by mouth every 8 (eight) hours as needed for nausea or vomiting.   PRESCRIPTION MEDICATION Apply 1 application topically daily. Estradial/ Testosterone cream   progesterone 100 MG capsule Commonly known as: PROMETRIUM Take 100 mg by mouth every evening.            Discharge Care Instructions  (From admission, onward)          Start     Ordered   04/10/20 0000  Leave dressing on - Keep it clean, dry, and intact until clinic visit        04/10/20 0942   04/09/20 0000  Leave dressing on - Keep it clean, dry, and intact until clinic visit        04/09/20 1242          Follow-up Information    Jodi Geralds, MD In 2 weeks.   Specialty: Orthopedic Surgery Why: as scheduled Contact information: [address] [place] [address] [phone]               Signed: Shanon Payor 04/20/2020, 12:23 PM

## 2021-04-19 DIAGNOSIS — M25561 Pain in right knee: Secondary | ICD-10-CM | POA: Diagnosis not present

## 2021-04-19 DIAGNOSIS — Z96651 Presence of right artificial knee joint: Secondary | ICD-10-CM | POA: Diagnosis not present

## 2021-05-12 DIAGNOSIS — R69 Illness, unspecified: Secondary | ICD-10-CM | POA: Diagnosis not present

## 2021-06-16 DIAGNOSIS — M25562 Pain in left knee: Secondary | ICD-10-CM | POA: Diagnosis not present

## 2021-06-27 ENCOUNTER — Encounter: Payer: Self-pay | Admitting: Radiology

## 2021-06-27 ENCOUNTER — Telehealth: Payer: Self-pay | Admitting: *Deleted

## 2021-06-27 ENCOUNTER — Ambulatory Visit (INDEPENDENT_AMBULATORY_CARE_PROVIDER_SITE_OTHER): Payer: 59 | Admitting: Radiology

## 2021-06-27 ENCOUNTER — Other Ambulatory Visit (HOSPITAL_COMMUNITY)
Admission: RE | Admit: 2021-06-27 | Discharge: 2021-06-27 | Disposition: A | Discharge status: Home / Self Care | Payer: 59 | Source: Ambulatory Visit | Attending: Radiology | Admitting: Radiology

## 2021-06-27 VITALS — BP 118/70 | Ht 67.0 in | Wt 181.0 lb

## 2021-06-27 DIAGNOSIS — R69 Illness, unspecified: Secondary | ICD-10-CM | POA: Diagnosis not present

## 2021-06-27 DIAGNOSIS — Z79890 Hormone replacement therapy (postmenopausal): Secondary | ICD-10-CM

## 2021-06-27 DIAGNOSIS — N958 Other specified menopausal and perimenopausal disorders: Secondary | ICD-10-CM | POA: Diagnosis not present

## 2021-06-27 DIAGNOSIS — R6882 Decreased libido: Secondary | ICD-10-CM | POA: Diagnosis not present

## 2021-06-27 DIAGNOSIS — Z01419 Encounter for gynecological examination (general) (routine) without abnormal findings: Secondary | ICD-10-CM

## 2021-06-27 DIAGNOSIS — Z1211 Encounter for screening for malignant neoplasm of colon: Secondary | ICD-10-CM

## 2021-06-27 MED ORDER — PROGESTERONE MICRONIZED 100 MG PO CAPS
100.0000 mg | ORAL_CAPSULE | Freq: Every evening | ORAL | 4 refills | Status: DC
Start: 2021-06-27 — End: 2021-06-28

## 2021-06-27 MED ORDER — IMVEXXY MAINTENANCE PACK 10 MCG VA INST: 1.0000 | VAGINAL_INSERT | VAGINAL | 4 refills | Status: DC

## 2021-06-27 MED ORDER — ESTROGEL 0.75 MG/1.25 GM (0.06%) TD GEL: 1.2500 g | Freq: Every day | TRANSDERMAL | 11 refills | Status: DC

## 2021-06-27 NOTE — Telephone Encounter (Signed)
Patient was seen today, Abigail Snyder said they the estrogel 0.75/1.25 gram and progesterone 100 mg capsule will need to be sent to local pharmacy and not [facility] "it is not something we dispense" . ? ? ?I will sent to local pharmacy. ? ? ?

## 2021-06-27 NOTE — Progress Notes (Signed)
? ?Abigail Snyder Nov 08, 1970 [id number] ? ? ?History: Postmenopausal 51 y.o. presents for annual exam as a new patient, transferred from [facility]. Doing well on estrogel, progesterone and IM testosterone, would like to continue. Getting married next month to her long term partner, Delsa Sale.  ? ? ?Gynecologic History ?Postmenopausal ?Last Pap: unsure.  ?Last mammogram: never.  ?Last colonoscopy: never ?HRT use: yes ? ?Obstetric History ?OB History  ?Gravida Para Term Preterm AB Living  ?4 2     2 2   ?SAB IAB Ectopic Multiple Live Births  ?  2     2  ?  ?# Outcome Date GA Lbr Len/2nd Weight Sex Delivery Anes PTL Lv  ?4 IAB           ?3 IAB           ?2 Para           ?1 Para           ? ? ? ?The following portions of the patient's history were reviewed and updated as appropriate: allergies, current medications, past family history, past medical history, past social history, past surgical history, and problem list. ? ?Review of Systems ?Pertinent items noted in HPI and remainder of comprehensive ROS otherwise negative.  ?Past medical history, past surgical history, family history and social history were all reviewed and documented in the EPIC chart. ? ?Exam: ? ?Vitals:  ? 06/27/21 0759  ?BP: 118/70  ?Weight: 181 lb (82.1 kg)  ?Height: 5\' 7"  (1.702 m)  ? ?Body mass index is 28.35 kg/m?. ? ?General appearance:  Normal ?Thyroid:  Symmetrical, normal in size, without palpable masses or nodularity. ?Respiratory ? Auscultation:  Clear without wheezing or rhonchi ?Cardiovascular ? Auscultation:  Regular rate, without rubs, murmurs or gallops ? Edema/varicosities:  Not grossly evident ?Abdominal ? Soft,nontender, without masses, guarding or rebound. ? Liver/spleen:  No organomegaly noted ? Hernia:  None appreciated ? Skin ? Inspection:  Grossly normal ?Breasts: Examined lying and sitting.  ? Right: Without masses, retractions, nipple discharge or axillary adenopathy. ? ? Left: Without masses, retractions, nipple  discharge or axillary adenopathy. ?Genitourinary  ? Inguinal/mons:  Normal without inguinal adenopathy ? External genitalia:  Normal appearing vulva with no masses, tenderness, or lesions ? BUS/Urethra/Skene's glands:  Normal ? Vagina:  Normal appearing with normal color and discharge, no lesions. Atrophy: moderate  ? Cervix:  Normal appearing without discharge or lesions ? Uterus:  Normal in size, shape and contour.  Midline and mobile, nontender ? Adnexa/parametria:   ?  Rt: Normal in size, without masses or tenderness. ?  Lt: Normal in size, without masses or tenderness. ? Anus and perineum: Normal ?  ? ?Patient informed chaperone available to be present for breast and pelvic exam. Patient has requested no chaperone to be present. Patient has been advised what will be completed during breast and pelvic exam.  ? ?Assessment/Plan:   ?1. Well woman exam with routine gynecological exam ?-Schedule mammogram at [facility] ?- Cytology - PAP( [facility]) ? ?2. Low libido ?Will tailor next testosterone dose as needed- due in 3 weeks ?- Testosterone , Free and Total ? ?3. Genitourinary syndrome of menopause ? ?- Estradiol (IMVEXXY MAINTENANCE PACK) 10 MCG INST; Place 1 tablet vaginally 2 (two) times a week.  Dispense: 24 each; Refill: 4 ? ?4. Hormone replacement therapy (HRT) ?Continue current HRT regimen ?- progesterone (PROMETRIUM) 100 MG capsule; Take 1 capsule (100 mg total) by mouth every evening.  Dispense: 90 capsule; Refill:  4 ?- ESTROGEL 0.75 MG/1.25 GM (0.06%) topical gel; Place 1.25 g onto the skin daily.  Dispense: 50 g; Refill: 11 ? ?5. Screening for colon cancer ? ?- Cologuard ?  ? ?Discussed SBE, colonoscopy and DEXA screening as directed. Recommend 19mins of exercise weekly, including weight bearing exercise. Encouraged the use of seatbelts and sunscreen.  ?Return in 1 year for annual or sooner prn. ? ?Kerry Dory WHNP-BC, 8:48 AM 06/27/2021  ?

## 2021-06-28 LAB — CYTOLOGY - PAP
Comment: NEGATIVE
Diagnosis: NEGATIVE
High risk HPV: NEGATIVE

## 2021-06-28 MED ORDER — ESTROGEL 0.75 MG/1.25 GM (0.06%) TD GEL
1.2500 g | Freq: Every day | TRANSDERMAL | 11 refills | Status: DC
Start: 2021-06-28 — End: 2021-11-08

## 2021-06-28 MED ORDER — PROGESTERONE MICRONIZED 100 MG PO CAPS: 100.0000 mg | ORAL_CAPSULE | Freq: Every evening | ORAL | 4 refills | Status: DC

## 2021-06-30 IMAGING — DX DG CHEST 2V
2 series · 2 of 2 positions shown · non-contrast
Comparison: None.

CLINICAL DATA: Preop knee surgery

EXAM:
CHEST - 2 VIEW

[chest pa]
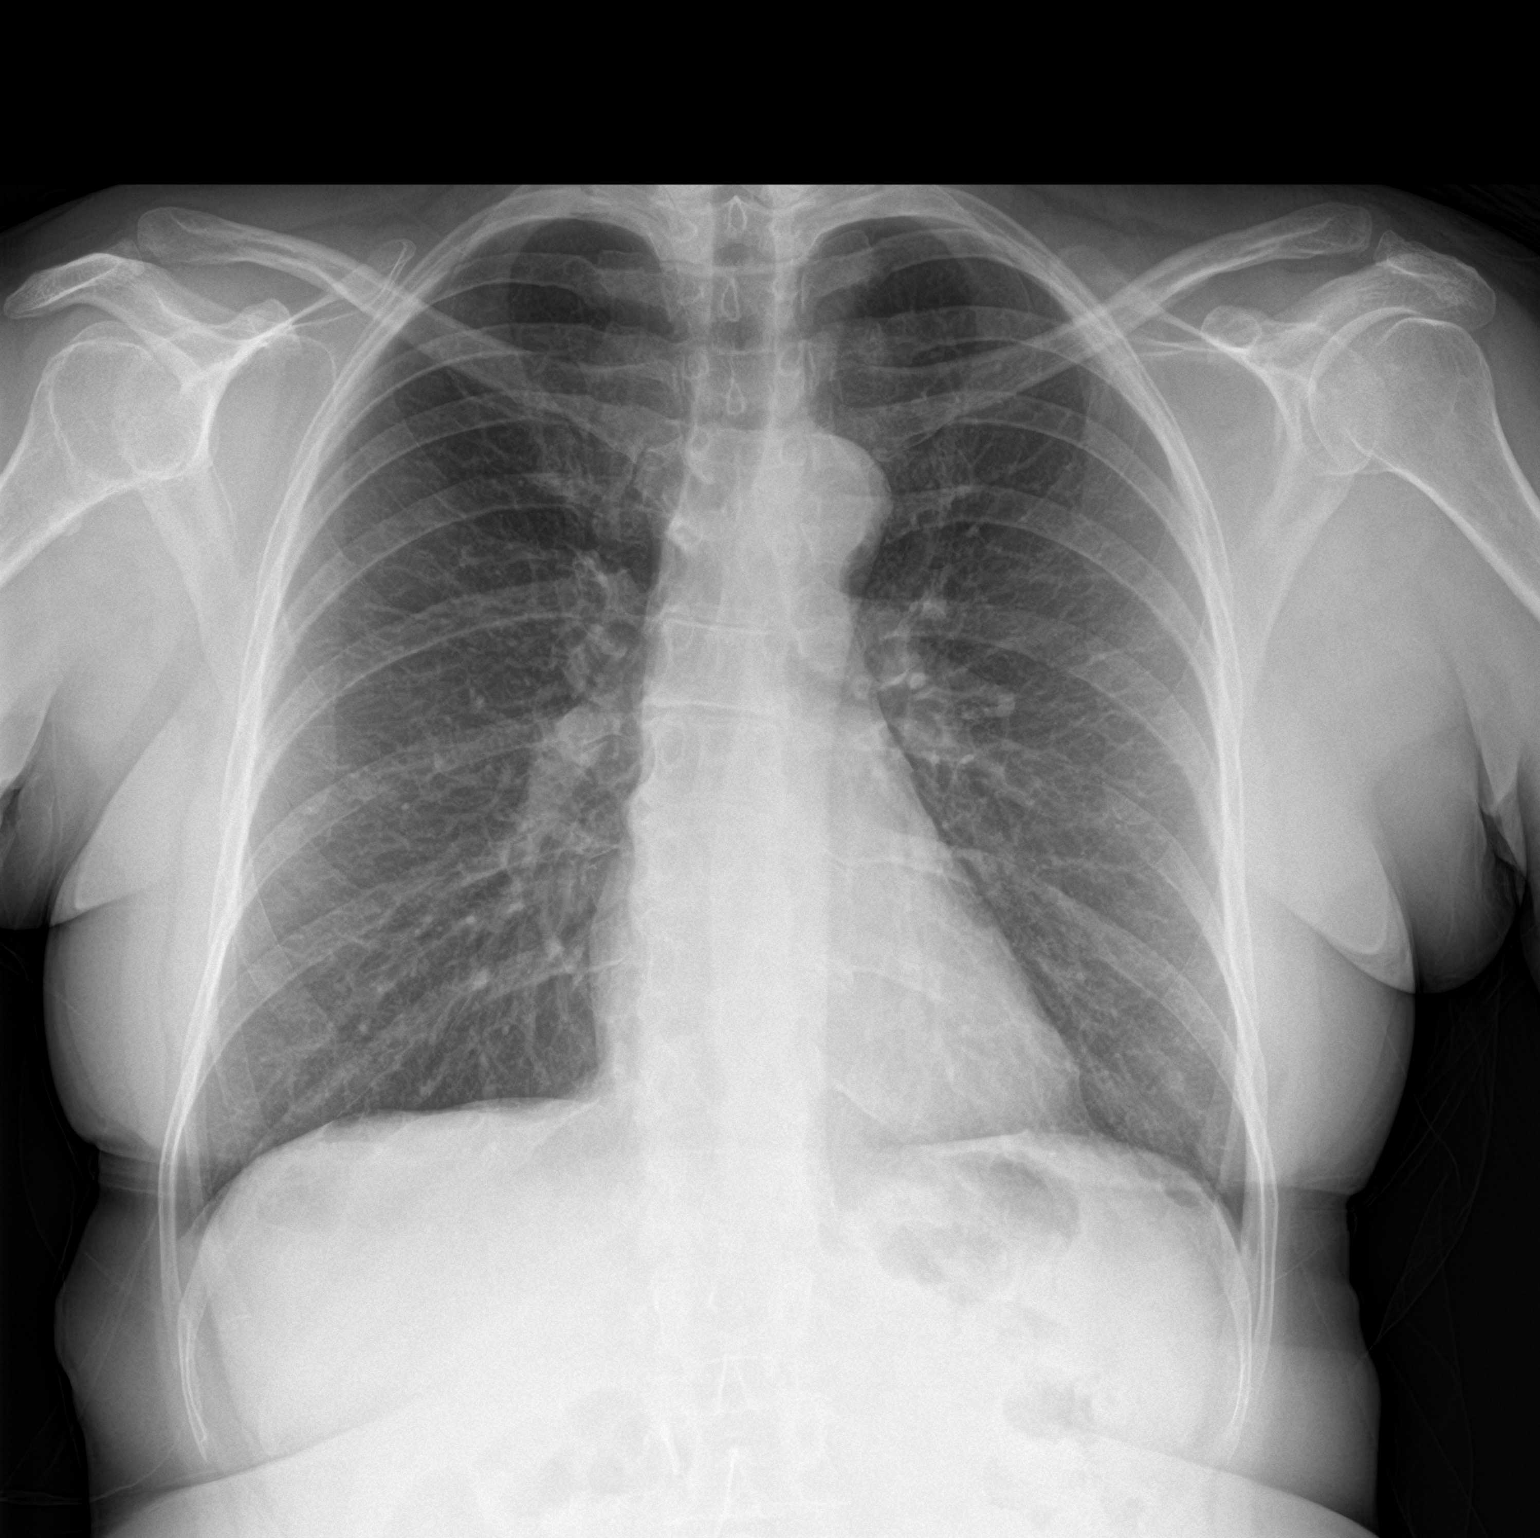

[chest lat]
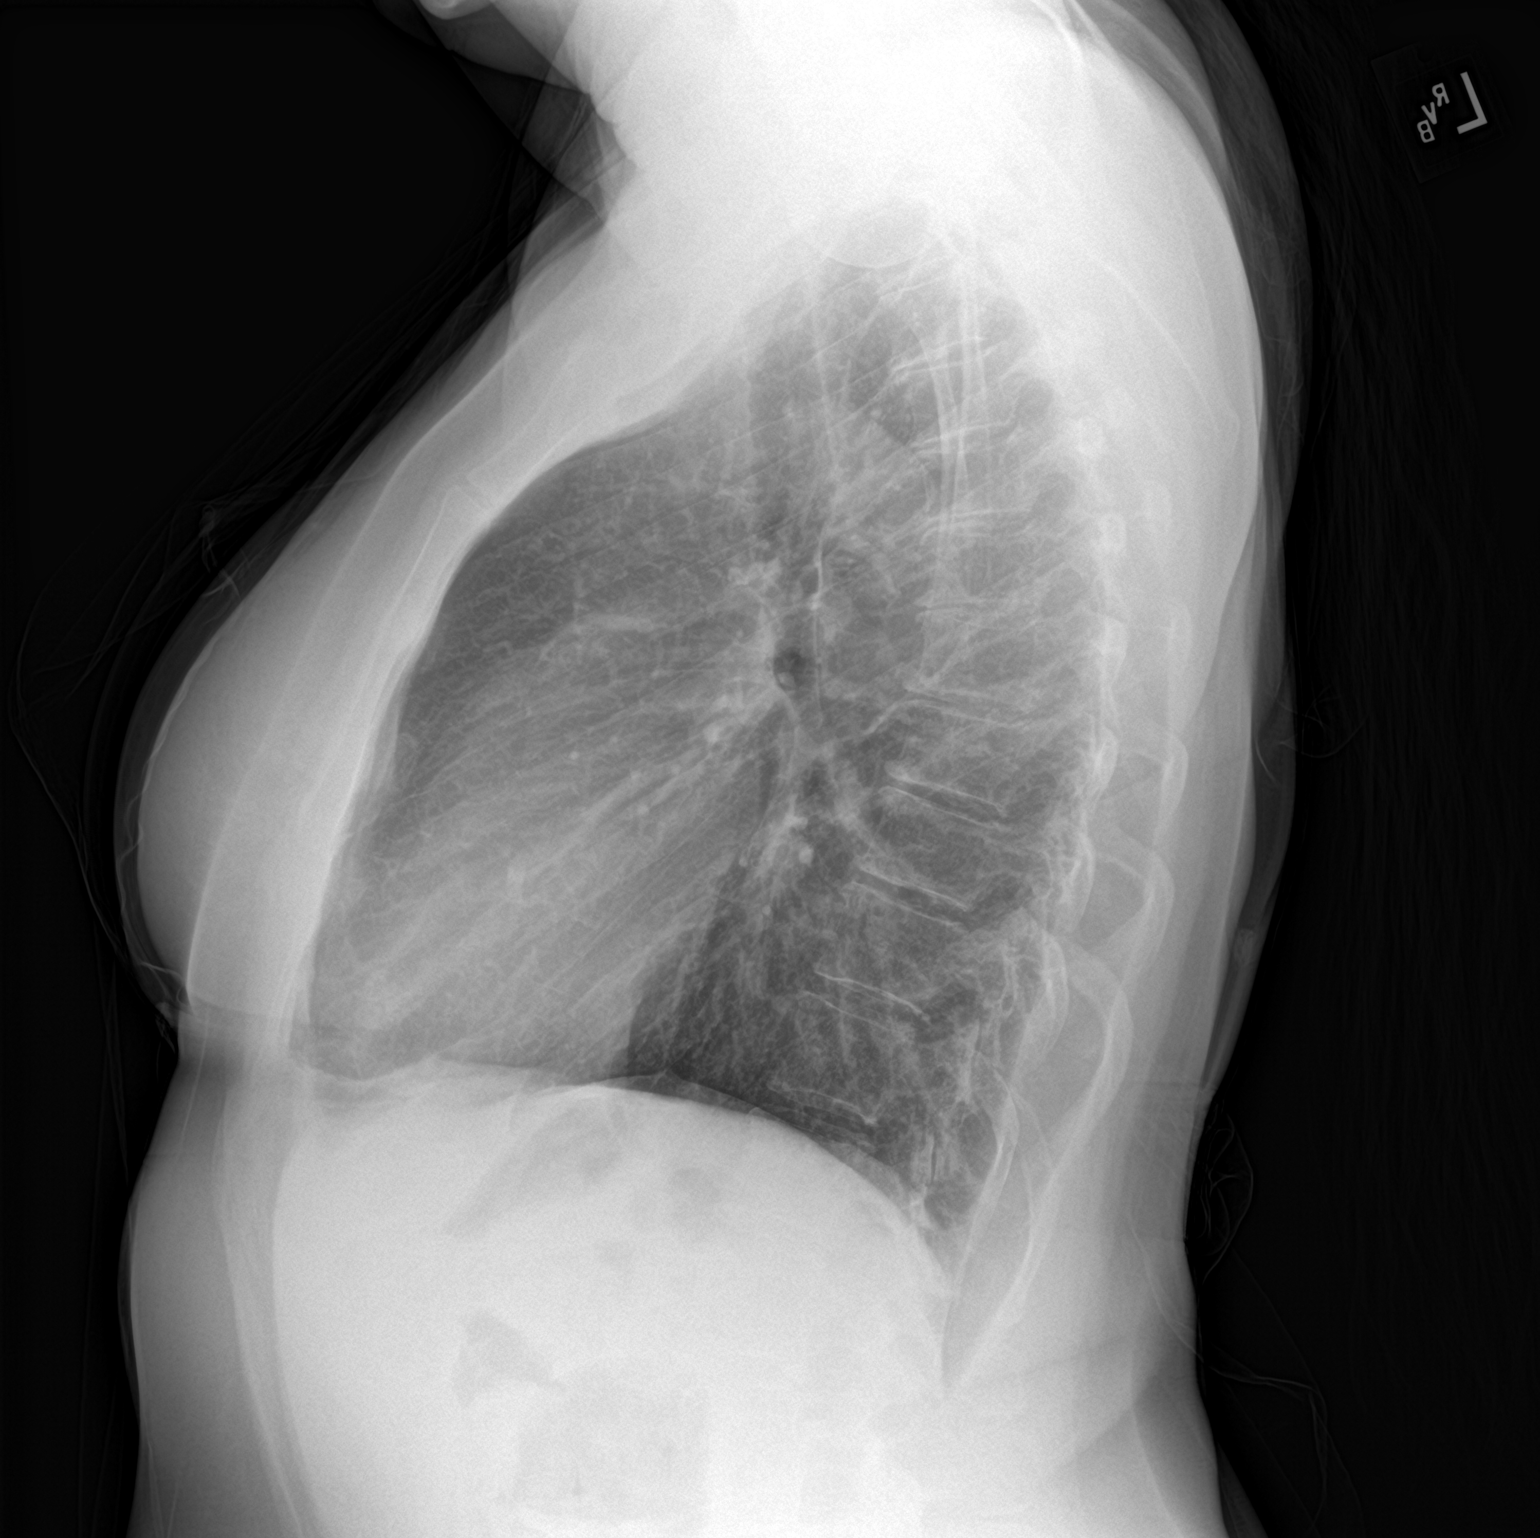

[2 of 2 positions shown; findings below may reference images not displayed]

FINDINGS: The heart size and mediastinal contours are within normal limits.
Both lungs are clear. The visualized skeletal structures are
unremarkable.
IMPRESSION: No active cardiopulmonary disease.

## 2021-07-02 LAB — TESTOSTERONE, FREE & TOTAL
Free Testosterone: 60.7 pg/mL — ABNORMAL HIGH (ref 0.1–6.4)
Testosterone, Total, LC-MS-MS: 570 ng/dL — ABNORMAL HIGH (ref 2–45)

## 2021-07-04 ENCOUNTER — Telehealth: Payer: Self-pay | Admitting: *Deleted

## 2021-07-04 NOTE — Telephone Encounter (Signed)
Patient called stating the incorrect dose from progesterone was sent to pharmacy. Patient said it should be 200 mg progesterone, not 100 mg tablets. Okay to switch? ?

## 2021-07-05 DIAGNOSIS — L259 Unspecified contact dermatitis, unspecified cause: Secondary | ICD-10-CM | POA: Diagnosis not present

## 2021-07-05 DIAGNOSIS — R69 Illness, unspecified: Secondary | ICD-10-CM | POA: Diagnosis not present

## 2021-07-05 MED ORDER — PROGESTERONE 200 MG PO CAPS
200.0000 mg | ORAL_CAPSULE | Freq: Every evening | ORAL | 4 refills | Status: DC
Start: 2021-07-05 — End: 2022-07-13

## 2021-07-05 NOTE — Telephone Encounter (Signed)
Ok to switch 

## 2021-07-05 NOTE — Telephone Encounter (Signed)
Rx sent- patient aware.  

## 2021-07-11 ENCOUNTER — Ambulatory Visit (INDEPENDENT_AMBULATORY_CARE_PROVIDER_SITE_OTHER): Payer: 59

## 2021-07-11 DIAGNOSIS — R6882 Decreased libido: Secondary | ICD-10-CM | POA: Diagnosis not present

## 2021-07-11 MED ORDER — TESTOSTERONE CYPIONATE 200 MG/ML IM SOLN
200.0000 mg | Freq: Once | INTRAMUSCULAR | Status: AC
  Administered 2021-07-11: 200 mg via INTRAMUSCULAR

## 2021-08-08 ENCOUNTER — Telehealth: Payer: Self-pay | Admitting: *Deleted

## 2021-08-08 NOTE — Telephone Encounter (Signed)
Patient called and left detailed message in triage voicemail states she would like to wean down from the testosterone 200 mg injection once monthly. She would like to drop down to 50 mg each month, patient asked how she would do this?  She also states you don't send in the imvexxy, however I see Rx was sent on 06/27/21. I will relay this to patient once I speak with her. She is currently in [place] on her honeymoon and will arrive back on Friday.  Please advise

## 2021-08-09 NOTE — Telephone Encounter (Signed)
May decrease to 50mg  with next due injection.

## 2021-08-10 NOTE — Telephone Encounter (Signed)
Left detailed message on patient voicemail per DRP access.  ?

## 2021-08-15 ENCOUNTER — Other Ambulatory Visit: Payer: Self-pay | Admitting: Radiology

## 2021-08-15 MED ORDER — TESTOSTERONE CYPIONATE 50 MG/ML IJ SOLN: 1.0000 mL | INTRAMUSCULAR | 3 refills | Status: DC

## 2021-08-15 NOTE — Telephone Encounter (Signed)
Patient called back stating she needs a new Rx because she will be decreasing dose and she would like to know what pharmacy you are going to send Rx. Patient reports she injects herself with medication.  Please advise

## 2021-08-16 ENCOUNTER — Telehealth: Payer: Self-pay | Admitting: *Deleted

## 2021-08-16 NOTE — Telephone Encounter (Signed)
PA done online via cover my meds for testosterone cypionate 50 mg injection. [employer] denied medication your plan covers this drug when you meet on of these conditions: You have primary or hypogonadotropic hypogonadism, you have gender dysphoria.  If you wish to appeal let me know what you would like the letter to say and I will type and fax to [facility].  Please advise

## 2021-08-22 ENCOUNTER — Ambulatory Visit (INDEPENDENT_AMBULATORY_CARE_PROVIDER_SITE_OTHER): Payer: 59 | Admitting: *Deleted

## 2021-08-22 DIAGNOSIS — R6882 Decreased libido: Secondary | ICD-10-CM | POA: Diagnosis not present

## 2021-08-22 MED ORDER — TESTOSTERONE CYPIONATE 200 MG/ML IM SOLN
200.0000 mg | INTRAMUSCULAR | Status: DC
Start: 2021-08-22 — End: 2022-08-07
  Administered 2021-08-22: 150 mg via INTRAMUSCULAR

## 2021-08-22 NOTE — Progress Notes (Signed)
Patient states her dose was dropped from 200 mg to 150 mg  Ok per Wyline Beady NP to change the dose.

## 2021-08-29 DIAGNOSIS — Z96651 Presence of right artificial knee joint: Secondary | ICD-10-CM | POA: Diagnosis not present

## 2021-08-29 DIAGNOSIS — M25561 Pain in right knee: Secondary | ICD-10-CM | POA: Diagnosis not present

## 2021-08-29 DIAGNOSIS — M25562 Pain in left knee: Secondary | ICD-10-CM | POA: Diagnosis not present

## 2021-08-29 DIAGNOSIS — M1712 Unilateral primary osteoarthritis, left knee: Secondary | ICD-10-CM | POA: Diagnosis not present

## 2021-09-12 DIAGNOSIS — J029 Acute pharyngitis, unspecified: Secondary | ICD-10-CM | POA: Diagnosis not present

## 2021-10-06 ENCOUNTER — Telehealth: Payer: Self-pay

## 2021-10-06 ENCOUNTER — Other Ambulatory Visit: Payer: Self-pay | Admitting: Radiology

## 2021-10-06 DIAGNOSIS — N958 Other specified menopausal and perimenopausal disorders: Secondary | ICD-10-CM

## 2021-10-06 MED ORDER — IMVEXXY MAINTENANCE PACK 10 MCG VA INST: 1.0000 | VAGINAL_INSERT | VAGINAL | 4 refills | Status: DC

## 2021-10-06 NOTE — Telephone Encounter (Signed)
Rx sent. May not be covered without sending to specialty pharmacy but we can try. Thanks for following up re: colon cancer screening.

## 2021-10-06 NOTE — Telephone Encounter (Signed)
-----   Message from Tanda Rockers, NP sent at 10/04/2021  9:45 AM EDT ----- Regarding: Cologuard Please follow up with patient. Has not completed Cologuard since ordered. Thanks. ----- Message ----- From: SYSTEM Sent: 09/25/2021  12:11 AM EDT To: Tanda Rockers, NP

## 2021-10-06 NOTE — Telephone Encounter (Signed)
Spoke with patient and informed her. °

## 2021-10-06 NOTE — Telephone Encounter (Signed)
Patient said she called previously and left message that she could not do the Cologard because it was "too disgusting".  I offered referral for colonoscopy but she declined stating she would consider and call back when ready to schedule.  She also mentioned that when she was in 06/27/21 you were sending Rx for vag supp but she never received them. She asked if instead of sending to mail order if [facility] could just [facility]. Pharmacy confirmed with patient.  3. Genitourinary syndrome of menopause   - Estradiol (IMVEXXY MAINTENANCE PACK) 10 MCG INST; Place 1 tablet vaginally 2 (two) times a week.  Dispense: 24 each; Refill: 4

## 2021-10-12 ENCOUNTER — Other Ambulatory Visit: Payer: Self-pay

## 2021-10-12 ENCOUNTER — Encounter: Payer: Self-pay | Admitting: Emergency Medicine

## 2021-10-12 ENCOUNTER — Emergency Department
Admission: EM | Admit: 2021-10-12 | Discharge: 2021-10-12 | Disposition: A | Discharge status: Home / Self Care | Payer: 59 | Attending: Emergency Medicine | Admitting: Emergency Medicine

## 2021-10-12 DIAGNOSIS — Z96651 Presence of right artificial knee joint: Secondary | ICD-10-CM | POA: Insufficient documentation

## 2021-10-12 DIAGNOSIS — T63481A Toxic effect of venom of other arthropod, accidental (unintentional), initial encounter: Secondary | ICD-10-CM | POA: Diagnosis not present

## 2021-10-12 DIAGNOSIS — T7840XA Allergy, unspecified, initial encounter: Secondary | ICD-10-CM | POA: Diagnosis not present

## 2021-10-12 MED ORDER — FAMOTIDINE 20 MG PO TABS: 20.0000 mg | ORAL_TABLET | Freq: Two times a day (BID) | ORAL | 0 refills | Status: DC

## 2021-10-12 MED ORDER — PREDNISONE 20 MG PO TABS: 40.0000 mg | ORAL_TABLET | Freq: Every day | ORAL | 0 refills | Status: AC

## 2021-10-12 MED ORDER — FAMOTIDINE 20 MG PO TABS
20.0000 mg | ORAL_TABLET | Freq: Once | ORAL | Status: AC
  Administered 2021-10-12: 20 mg via ORAL
  Filled 2021-10-12: qty 1

## 2021-10-12 MED ORDER — PREDNISONE 20 MG PO TABS
40.0000 mg | ORAL_TABLET | Freq: Once | ORAL | Status: AC
Start: 2021-10-12 — End: 2021-10-12
  Administered 2021-10-12: 40 mg via ORAL
  Filled 2021-10-12: qty 2

## 2021-10-12 NOTE — ED Notes (Signed)
See triage note. Pt ambulatory to room and in NAD. Pt stung by yellow jacket yesterday. Pt has bee allergy.

## 2021-10-12 NOTE — ED Triage Notes (Signed)
States was stung by a yellow jacket yesterday morning at 1100.  Right hand with some localized swelling.  States has bee allergy, in past only stung by wasps.    AAOx3.  Skin warm and dry.  NAD

## 2021-10-12 NOTE — ED Provider Notes (Signed)
[facility] Provider Note    Event Date/Time   First MD Initiated Contact with Patient 10/12/21 [phone]     (approximate)   History   Allergic Reaction   HPI  Abigail Snyder is a 51 y.o. female who presents today for evaluation after a yellowjacket sting.  Patient reports that this occurred yesterday morning at approximately 11 AM.  She was stung on her right hand, her right knee, and her left ankle.  She reports that this morning she continued to still have a little bit of swelling in these isolated areas.  She took Benadryl with improvement of her symptoms.  She denies tongue or lip swelling, throat swelling, trouble breathing or swallowing, voice change, nausea, vomiting, diarrhea, abdominal pain, or rash.  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 --      Excl. in GC? --     Most recent vital signs: Vitals:   10/12/21 0729  BP: (!) 125/58  Pulse: 63  Resp: 16  Temp: 97.8 F (36.6 C)  SpO2: 97%    Physical Exam Vitals and nursing note reviewed.  Constitutional:      General: Awake and alert. No acute distress.    Appearance: Normal appearance. The patient is normal weight.  HENT:     Head: Normocephalic and atraumatic.     Mouth: Mucous membranes are moist.  No tongue or lip swelling, no intraoral swelling, normal voice Eyes:     General: PERRL. Normal EOMs        Right eye: No discharge.        Left  eye: No discharge.     Conjunctiva/sclera: Conjunctivae normal.  Cardiovascular:     Rate and Rhythm: Normal rate and regular rhythm.     Pulses: Normal pulses.     Heart sounds: Normal heart sounds Pulmonary:     Effort: Pulmonary effort is normal. No respiratory distress.     Breath sounds: Normal breath sounds.  No wheezing Abdominal:     Abdomen is soft. There is no abdominal tenderness. No rebound or guarding. No distention. Musculoskeletal:        General: No swelling. Normal range of motion.     Cervical back: Normal range of motion and neck supple.  Skin:    General: Skin is warm and dry.     Capillary Refill: Capillary refill takes less than 2 seconds.     Findings: No rash.  3 x 3 cm area of swelling over the dorsum of her right hand with visible puncture site, no retained stinger.  Similar-appearing and similar sized lesion to her lateral  left ankle and smaller area to her anterior right knee.  No swelling to her ankle or to her knee.  She has full and normal range of motion of all locations.  No lymphangitis.  No tenderness.  No open or draining wounds Neurological:     Mental Status: The patient is awake and alert.      ED Results / Procedures / Treatments   Labs (all labs ordered are listed, but only abnormal results are displayed) Labs Reviewed - No data to display   EKG     RADIOLOGY     PROCEDURES:  Critical Care performed:   Procedures   MEDICATIONS ORDERED IN ED: Medications  famotidine (PEPCID) tablet 20 mg (20 mg Oral Given 10/12/21 0748)  predniSONE (DELTASONE) tablet 40 mg (40 mg Oral Given 10/12/21 0748)     IMPRESSION / MDM / ASSESSMENT AND PLAN / ED COURSE  I reviewed the triage vital signs and the nursing notes.   Differential diagnosis includes, but is not limited to, localized reaction versus early infection.  Patient is awake and alert, hemodynamically stable and afebrile.  She is in no acute distress.  She has no tongue or lip  swelling, throat swelling, trouble breathing or swallowing, voice change, abdominal pain, nausea, vomiting, diarrhea, or systemic rash to suggest anaphylaxis or angioedema.  Her symptoms are consistent with localized reaction.  She has small amount of swelling without significant erythema.  I do feel that her symptoms are more consistent with localized reaction rather than early infection.  There is no pain or redness at the sites.  No fevers.  She was treated symptomatically with antihistamines (Benadryl was held given that she is driving, though advised to take this at home) and prednisone.  She reported mild improvement of her symptoms.  She was started on these medications for an additional 3 days.  She was reminded that she cannot take Benadryl if she plans on driving, operating heavy machinery, performing tasks require concentration.  She understands and agrees.  We also discussed strict return precautions and the importance of close outpatient follow-up.  Patient was discharged in stable condition.   Patient's presentation is most consistent with acute illness / injury with system symptoms.    FINAL CLINICAL IMPRESSION(S) / ED DIAGNOSES   Final diagnoses:  Allergic reaction, initial encounter  Insect stings, accidental or unintentional, initial encounter     Rx / DC Orders   ED Discharge Orders          Ordered    predniSONE (DELTASONE) 20 MG tablet  Daily with breakfast        10/12/21 0814    famotidine (PEPCID) 20 MG tablet  2 times daily        10/12/21 2725             Note:  This document was prepared using Dragon voice recognition software and may include unintentional dictation errors.   Keturah Shavers 10/12/21 0827    Dionne Bucy, MD 10/12/21 [phone]

## 2021-10-12 NOTE — Discharge Instructions (Signed)
You may take the medications as prescribed.  Please remember that he can also take Benadryl, but you cannot drive, operate heavy machinery, or perform any tests or require concentration while taking Benadryl as this will make you sleepy.  Please return if you develop any worsening rash, trouble breathing or swallowing, tongue or lip swelling, throat swelling, wheezing, abdominal pain, nausea, vomiting, diarrhea, full body rash, or any other concerns.  It was a pleasure caring for you today.

## 2021-10-17 DIAGNOSIS — M79675 Pain in left toe(s): Secondary | ICD-10-CM | POA: Diagnosis not present

## 2021-11-08 ENCOUNTER — Ambulatory Visit (INDEPENDENT_AMBULATORY_CARE_PROVIDER_SITE_OTHER): Payer: 59 | Admitting: Radiology

## 2021-11-08 VITALS — BP 118/80

## 2021-11-08 DIAGNOSIS — N760 Acute vaginitis: Secondary | ICD-10-CM | POA: Diagnosis not present

## 2021-11-08 DIAGNOSIS — N952 Postmenopausal atrophic vaginitis: Secondary | ICD-10-CM

## 2021-11-08 DIAGNOSIS — Z79890 Hormone replacement therapy (postmenopausal): Secondary | ICD-10-CM | POA: Diagnosis not present

## 2021-11-08 LAB — WET PREP FOR TRICH, YEAST, CLUE

## 2021-11-08 MED ORDER — ESTRADIOL 0.1 MG/24HR TD PTTW
1.0000 | MEDICATED_PATCH | TRANSDERMAL | 12 refills | Status: DC
Start: 2021-11-10 — End: 2021-11-29

## 2021-11-08 MED ORDER — ESTRADIOL 0.1 MG/GM VA CREA: 1.0000 g | TOPICAL_CREAM | VAGINAL | 12 refills | Status: DC

## 2021-11-08 NOTE — Progress Notes (Signed)
      Subjective: Abigail Snyder is a 51 y.o. female who complains of vaginal itching/irritation x 2 weeks. Noticed whitening of labia minora (tried estrogen inserts and #2 diflucan without relief). Hot flashes have returned    Review of Systems  All other systems reviewed and are negative.   Past Medical History:  Diagnosis Date   Family history of adverse reaction to anesthesia    slow to wake up   Pneumonia 2019      Objective:  Today's Vitals   11/08/21 0956  BP: 118/80   There is no height or weight on file to calculate BMI.   -General: no acute distress -Vulva: without lesions or discharge. Slight atrophy and loss of vulvar architecture. -Vagina: Atrophic, scant discharge present -Cervix: no lesion or discharge, no CMT -Perineum: no lesions -Uterus: Mobile, non tender -Adnexa: no masses or tenderness  Microscopic wet-mount exam shows negative for pathogens, normal epithelial cells.   Chaperone offered and declined.  Assessment:/Plan:   1. Acute vaginitis Reassured negative wet prep - WET PREP FOR TRICH, YEAST, CLUE  2. Hormone replacement therapy (HRT) - estradiol (VIVELLE-DOT) 0.1 MG/24HR patch; Place 1 patch (0.1 mg total) onto the skin 2 (two) times a week.  Dispense: 8 patch; Refill: 12  3. Atrophic vaginitis - estradiol (ESTRACE VAGINAL) 0.1 MG/GM vaginal cream; Place 1 g vaginally 3 (three) times a week.  Dispense: 42.5 g; Refill: 12 Use a small pea sized amount on vulva as well to treat vulvar atrophy    Will contact patient with results of testing completed today. Avoid intercourse until symptoms are resolved. Safe sex encouraged. Avoid the use of soaps or perfumed products in the peri area. Avoid tub baths and sitting in sweaty or wet clothing for prolonged periods of time.

## 2021-11-18 ENCOUNTER — Telehealth: Payer: Self-pay | Admitting: *Deleted

## 2021-11-18 MED ORDER — FLUCONAZOLE 150 MG PO TABS: 150.0000 mg | ORAL_TABLET | Freq: Once | ORAL | 0 refills | Status: AC

## 2021-11-18 NOTE — Telephone Encounter (Signed)
Patient was treated with antibiotic amoxacillin x 7 days due to oral abscess. No has yeast,asked if diflucan tablet can be sent to pharmacy? Last annual exam was 06/2021. Please advise

## 2021-11-18 NOTE — Telephone Encounter (Signed)
Dr.Lavoie replied "Agree with Fluconazole "  Patient aware Rx sent.

## 2021-11-23 ENCOUNTER — Telehealth: Payer: Self-pay | Admitting: *Deleted

## 2021-11-23 NOTE — Telephone Encounter (Signed)
Patient informed. 

## 2021-11-23 NOTE — Telephone Encounter (Signed)
Patient called placed at new vivelle dot patch on today however the patch won't stick to her skin( she changes her patches on Sundays and Wednesdays) she asked what to do? Try to put another patch on? Don't use a patch until Sunday? Use estradiol cream today and then put a new patch on Sunday? Please advise

## 2021-11-23 NOTE — Telephone Encounter (Signed)
May use the estrogel daily until Sunday. Make sure to put the patch on after a shower when skin is clean and VERY dry. Avoid using lotion in the area of patch placement.

## 2021-11-28 ENCOUNTER — Other Ambulatory Visit: Payer: Self-pay | Admitting: Radiology

## 2021-11-28 DIAGNOSIS — Z79890 Hormone replacement therapy (postmenopausal): Secondary | ICD-10-CM

## 2021-11-29 NOTE — Telephone Encounter (Signed)
Patient insurance requesting 90 day supply

## 2021-12-26 ENCOUNTER — Other Ambulatory Visit: Payer: Self-pay | Admitting: Radiology

## 2021-12-26 DIAGNOSIS — Z79890 Hormone replacement therapy (postmenopausal): Secondary | ICD-10-CM

## 2021-12-27 NOTE — Telephone Encounter (Signed)
Med refill request:0.1 estradiol patch twice weekly Last AEX: 06/27/21 JC Next AEX: Not scheduled  Requesting 90 day supply Rx sent on 12/01/21 for #24/5RF  Rx refused. Note to pharamcy   Routing to provider for final review. Will close encounter.

## 2022-01-19 ENCOUNTER — Ambulatory Visit: Payer: 59 | Admitting: Radiology

## 2022-01-19 ENCOUNTER — Encounter: Payer: Self-pay | Admitting: Radiology

## 2022-01-19 VITALS — BP 110/80 | Ht 67.0 in | Wt 192.0 lb

## 2022-01-19 DIAGNOSIS — Z79890 Hormone replacement therapy (postmenopausal): Secondary | ICD-10-CM | POA: Diagnosis not present

## 2022-01-19 DIAGNOSIS — R635 Abnormal weight gain: Secondary | ICD-10-CM | POA: Diagnosis not present

## 2022-01-19 NOTE — Progress Notes (Signed)
   Abigail Snyder 01-Mar-1970 [id number]   History:  51 y.o. c/o weight gain of 15lbs over the past 6 months, breast tenderness and increase in size. Stopped her testosterone.  Gynecologic History No LMP recorded. Patient is postmenopausal.     Obstetric History OB History  Gravida Para Term Preterm AB Living  4 2     2 2   SAB IAB Ectopic Multiple Live Births    2     2    # Outcome Date GA Lbr Len/2nd Weight Sex Delivery Anes PTL Lv  4 IAB           3 IAB           2 Para           1 Para              The following portions of the patient's history were reviewed and updated as appropriate: allergies, current medications, past family history, past medical history, past social history, past surgical history, and problem list.  Review of Systems Pertinent items noted in HPI and remainder of comprehensive ROS otherwise negative.   Past medical history, past surgical history, family history and social history were all reviewed and documented in the EPIC chart.    Exam:  Vitals:   01/19/22 0931  BP: 110/80  Weight: 192 lb (87.1 kg)  Height: 5\' 7"  (1.702 m)   Body mass index is 30.07 kg/m.  Physical Exam Vitals reviewed.  Constitutional:      Appearance: Normal appearance. She is obese.  HENT:     Head: Normocephalic and atraumatic.  Neurological:     Mental Status: She is alert.  Psychiatric:        Mood and Affect: Mood normal.        Thought Content: Thought content normal.        Judgment: Judgment normal.      Assessment/Plan:   1. Weight gain - Thyroid Panel With TSH - HgB A1c  2. Hormone replacement therapy (HRT) - Estradiol - Testosterone Total,Free,Bio, Males-([facility])    Madex Seals B WHNP-BC 10:30 AM 01/19/2022

## 2022-01-20 LAB — HEMOGLOBIN A1C
Hgb A1c MFr Bld: 5.3 % of total Hgb (ref <5.7)
Mean Plasma Glucose: 105 mg/dL
eAG (mmol/L): 5.8 mmol/L

## 2022-01-20 LAB — THYROID PANEL WITH TSH
Free Thyroxine Index: 1.8 (ref 1.4–3.8)
T3 Uptake: 31 % (ref 22–35)
T4, Total: 5.8 ug/dL (ref 5.1–11.9)
TSH: 2.09 mIU/L

## 2022-01-20 LAB — ESTRADIOL: Estradiol: 141 pg/mL

## 2022-01-22 ENCOUNTER — Other Ambulatory Visit: Payer: Self-pay | Admitting: Radiology

## 2022-01-22 DIAGNOSIS — Z79890 Hormone replacement therapy (postmenopausal): Secondary | ICD-10-CM

## 2022-01-24 ENCOUNTER — Telehealth: Payer: Self-pay | Admitting: *Deleted

## 2022-01-24 DIAGNOSIS — Z79890 Hormone replacement therapy (postmenopausal): Secondary | ICD-10-CM

## 2022-01-24 DIAGNOSIS — N952 Postmenopausal atrophic vaginitis: Secondary | ICD-10-CM

## 2022-01-24 LAB — TESTOS,TOTAL,FREE AND SHBG (FEMALE)
Free Testosterone: 8 pg/mL — ABNORMAL HIGH (ref 0.1–6.4)
Sex Hormone Binding: 92.1 nmol/L (ref 17–124)
Testosterone, Total, LC-MS-MS: 143 ng/dL — ABNORMAL HIGH (ref 2–45)

## 2022-01-24 NOTE — Telephone Encounter (Signed)
Patient left detailed message.  Requesting Rx for estradiol, progesterone and testosterone to be sent as 90 day supply. States she will be out of town and will be unable to fill them.   Rx for estradiol vaginal cream, estradiol patch, and progesterone already sent as 90 day supply. Call placed to patient, left detailed message advising as seen above. Return call to [facility] Triage at [phone], OPT 4.

## 2022-01-26 ENCOUNTER — Other Ambulatory Visit: Payer: Self-pay | Admitting: *Deleted

## 2022-01-26 MED ORDER — TESTOSTERONE CYPIONATE 200 MG/ML IM SOLN: 200.0000 mg | INTRAMUSCULAR | 0 refills | Status: DC

## 2022-01-26 NOTE — Telephone Encounter (Signed)
Medication refill request: testosterone  Last AEX:  06-27-21 JC Next AEX: not scheduled  Last MMG (if hormonal medication request): ? never Refill authorized: Please advise.   Spoke with patient and advised of results. See results note. [facility] [place] Requesting 3 month supply as she will be traveling for the holidays.   Medication pended for #3, 0RF. Please refill if appropriate.

## 2022-02-07 NOTE — Telephone Encounter (Signed)
No return call from patient.  Detailed message left on 01/24/22.   Encounter closed.   Routing to [employer].

## 2022-03-02 DIAGNOSIS — E669 Obesity, unspecified: Secondary | ICD-10-CM | POA: Diagnosis not present

## 2022-03-02 DIAGNOSIS — Z713 Dietary counseling and surveillance: Secondary | ICD-10-CM | POA: Diagnosis not present

## 2022-03-02 DIAGNOSIS — K219 Gastro-esophageal reflux disease without esophagitis: Secondary | ICD-10-CM | POA: Diagnosis not present

## 2022-03-02 DIAGNOSIS — R69 Illness, unspecified: Secondary | ICD-10-CM | POA: Diagnosis not present

## 2022-03-03 ENCOUNTER — Telehealth: Payer: Self-pay

## 2022-03-03 NOTE — Telephone Encounter (Signed)
No, it *may* even help with libido

## 2022-03-03 NOTE — Telephone Encounter (Signed)
Pt calling to inquire if she were to start taking wellbutrin, would that decrease the effects of the HRTs that she is currently taking at all?   Currently using/taking estrogen cream, patches, progesterone, and depo T injections.   Please advise.

## 2022-03-08 NOTE — Telephone Encounter (Signed)
Pt was notified soon after JC's response/advice and voiced understanding.

## 2022-04-27 DIAGNOSIS — M25511 Pain in right shoulder: Secondary | ICD-10-CM | POA: Diagnosis not present

## 2022-04-27 DIAGNOSIS — M19011 Primary osteoarthritis, right shoulder: Secondary | ICD-10-CM | POA: Diagnosis not present

## 2022-07-13 ENCOUNTER — Other Ambulatory Visit: Payer: Self-pay | Admitting: Radiology

## 2022-07-13 NOTE — Telephone Encounter (Signed)
Med refill request: Progesterone Last AEX: 06/27/21 Next AEX: 08/07/22 Last MMG (if hormonal med), no mammo seen in record Refill authorized: Please Advise?

## 2022-07-21 DIAGNOSIS — R42 Dizziness and giddiness: Secondary | ICD-10-CM | POA: Diagnosis not present

## 2022-07-31 DIAGNOSIS — M5412 Radiculopathy, cervical region: Secondary | ICD-10-CM | POA: Diagnosis not present

## 2022-07-31 DIAGNOSIS — Z0189 Encounter for other specified special examinations: Secondary | ICD-10-CM | POA: Diagnosis not present

## 2022-08-04 ENCOUNTER — Telehealth: Payer: Self-pay

## 2022-08-04 DIAGNOSIS — M542 Cervicalgia: Secondary | ICD-10-CM | POA: Diagnosis not present

## 2022-08-04 NOTE — Telephone Encounter (Signed)
Pt reports experiencing some period like cramping and a smell. States she just got back from beach vacation and feels as if she may have a vaginal infection and/or UTI.   Pt has appt on 6/17 @ 830 for AEX and desired to know if she can be checked for infections. Pt advised, yes. However, if sxs worsen over the weekend, then to proceed to nearest UC for an evaluation. Pt voiced understanding. Will route to provider for review and close.

## 2022-08-07 ENCOUNTER — Encounter: Payer: Self-pay | Admitting: Radiology

## 2022-08-07 ENCOUNTER — Ambulatory Visit: Payer: 59 | Admitting: Family Medicine

## 2022-08-07 ENCOUNTER — Encounter: Payer: Self-pay | Admitting: Family Medicine

## 2022-08-07 ENCOUNTER — Ambulatory Visit (INDEPENDENT_AMBULATORY_CARE_PROVIDER_SITE_OTHER): Payer: 59 | Admitting: Radiology

## 2022-08-07 VITALS — BP 126/84 | Ht 66.25 in | Wt 187.0 lb

## 2022-08-07 VITALS — BP 100/78 | HR 72 | Temp 98.6°F | Ht 66.25 in | Wt 191.2 lb

## 2022-08-07 DIAGNOSIS — Z96651 Presence of right artificial knee joint: Secondary | ICD-10-CM | POA: Diagnosis not present

## 2022-08-07 DIAGNOSIS — R102 Pelvic and perineal pain: Secondary | ICD-10-CM

## 2022-08-07 DIAGNOSIS — Z1211 Encounter for screening for malignant neoplasm of colon: Secondary | ICD-10-CM | POA: Diagnosis not present

## 2022-08-07 DIAGNOSIS — N949 Unspecified condition associated with female genital organs and menstrual cycle: Secondary | ICD-10-CM

## 2022-08-07 DIAGNOSIS — Z79890 Hormone replacement therapy (postmenopausal): Secondary | ICD-10-CM

## 2022-08-07 DIAGNOSIS — H908 Mixed conductive and sensorineural hearing loss, unspecified: Secondary | ICD-10-CM | POA: Diagnosis not present

## 2022-08-07 DIAGNOSIS — M069 Rheumatoid arthritis, unspecified: Secondary | ICD-10-CM | POA: Insufficient documentation

## 2022-08-07 DIAGNOSIS — Z1159 Encounter for screening for other viral diseases: Secondary | ICD-10-CM | POA: Diagnosis not present

## 2022-08-07 DIAGNOSIS — Z01419 Encounter for gynecological examination (general) (routine) without abnormal findings: Secondary | ICD-10-CM

## 2022-08-07 DIAGNOSIS — Z1212 Encounter for screening for malignant neoplasm of rectum: Secondary | ICD-10-CM | POA: Diagnosis not present

## 2022-08-07 DIAGNOSIS — Z1231 Encounter for screening mammogram for malignant neoplasm of breast: Secondary | ICD-10-CM

## 2022-08-07 DIAGNOSIS — Z0000 Encounter for general adult medical examination without abnormal findings: Secondary | ICD-10-CM | POA: Diagnosis not present

## 2022-08-07 DIAGNOSIS — G43909 Migraine, unspecified, not intractable, without status migrainosus: Secondary | ICD-10-CM | POA: Insufficient documentation

## 2022-08-07 DIAGNOSIS — N9489 Other specified conditions associated with female genital organs and menstrual cycle: Secondary | ICD-10-CM

## 2022-08-07 LAB — WET PREP FOR TRICH, YEAST, CLUE

## 2022-08-07 LAB — HM PAP SMEAR: HM Pap smear: NORMAL

## 2022-08-07 MED ORDER — PROGESTERONE 200 MG PO CAPS: 200.0000 mg | ORAL_CAPSULE | Freq: Every day | ORAL | 4 refills | Status: DC

## 2022-08-07 NOTE — Progress Notes (Signed)
   Abigail Snyder Sep 13, 1970 [id number]   History: Postmenopausal 52 y.o. presents for annual exam. C/o pelvic pain since last week.    Gynecologic History Postmenopausal Last Pap: 2023. Results were: normal Last mammogram: never.  HRT use: current  Obstetric History OB History  Gravida Para Term Preterm AB Living  4 2     2 1   SAB IAB Ectopic Multiple Live Births    2     2    # Outcome Date GA Lbr Len/2nd Weight Sex Delivery Anes PTL Lv  4 IAB           3 IAB           2 Para           1 Para              The following portions of the patient's history were reviewed and updated as appropriate: allergies, current medications, past family history, past medical history, past social history, past surgical history, and problem list.  Review of Systems Pertinent items noted in HPI and remainder of comprehensive ROS otherwise negative.  Past medical history, past surgical history, family history and social history were all reviewed and documented in the EPIC chart.  Exam:  Vitals:   08/07/22 0800  BP: 126/84  Weight: 187 lb (84.8 kg)  Height: 5' 6.25" (1.683 m)   Body mass index is 29.96 kg/m.  General appearance:  Normal Thyroid:  Symmetrical, normal in size, without palpable masses or nodularity. Respiratory  Auscultation:  Clear without wheezing or rhonchi Cardiovascular  Auscultation:  Regular rate, without rubs, murmurs or gallops  Edema/varicosities:  Not grossly evident Abdominal  Soft,nontender, without masses, guarding or rebound.  Liver/spleen:  No organomegaly noted  Hernia:  None appreciated  Skin  Inspection:  Grossly normal Breasts: Examined lying and sitting.   Right: Without masses, retractions, nipple discharge or axillary adenopathy.   Left: Without masses, retractions, nipple discharge or axillary adenopathy. Genitourinary   Inguinal/mons:  Normal without inguinal adenopathy  External genitalia:  Normal appearing vulva with no masses,  tenderness, or lesions  BUS/Urethra/Skene's glands:  Normal  Vagina:  Normal appearing with normal color and discharge, no lesions. Atrophy: mild   Cervix:  Normal appearing without discharge or lesions  Uterus:  Normal in size, shape and contour.  Midline and mobile, nontender  Adnexa/parametria:     Rt: Normal in size, without masses or tenderness.   Lt: Normal in size, without masses or tenderness.  Anus and perineum: Normal    Raynelle Fanning, CMA present for exam  Assessment/Plan:   1. Well woman exam with routine gynecological exam Pap up to date  2. Hormone replacement therapy (HRT) Pellet [facility]rapt at [facility] for testosterone and estrogen - progesterone (PROMETRIUM) 200 MG capsule; Take 1 capsule (200 mg total) by mouth at bedtime.  Dispense: 180 capsule; Refill: 4  3. Pelvic pain Negative UA culture sent - Urinalysis,Complete w/RFL Culture If pain persists and negative culture will order ultrasound  4. Vaginal burning Neg wet prep - restart estrace cream  - WET PREP FOR TRICH, YEAST, CLUE    Discussed SBE, colonoscopy and DEXA screening as directed. Recommend of exercise weekly, including weight bearing exercise. Encouraged the use of seatbelts and sunscreen.  Return in 1 year for annual or sooner prn.  Tanda Rockers WHNP-BC, 8:43 AM 08/07/2022

## 2022-08-07 NOTE — Progress Notes (Signed)
Subjective  Chief Complaint  Patient presents with   Establish Care    Pt just need to get up under a Dr care. Transferring from [facility]. Pt needs referral placed for colonoscopy/mammo. Has never had them done     HPI: Abigail Snyder is a 52 y.o. female who presents to [facility] [place] today for a to establish care and Female Wellness Visit. She also has the concerns and/or needs as listed above in the chief complaint. These will be addressed in addition to the Health Maintenance Visit. I have reviewed her chart: today had female wellness with gyn. Rviewed ortho notes and hearing / ent notes. Reviewed labs now back to 2020.   Wellness Visit: annual visit with health maintenance review and exam without Pap  HM: pap current; normal 2023; needs mammo and CRC screen. Avg risk.  Eligible for Shingrix vaccination.  She is hesitant. Chronic disease f/u and/or acute problem visit: (deemed necessary to be done in addition to the wellness visit): HRT, postmenopausal for vasomotor symptoms.  On estrogen and testosterone supplements.  Sees GYN. 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  Assessment  1. Annual physical exam   2. Mixed conductive and sensorineural hearing loss, unspecified laterality   3. Postmenopausal HRT (hormone replacement therapy)   4. Screening for colorectal cancer   5. Status post total knee replacement, right   6. Need for hepatitis C screening test   7. Screening mammogram for breast cancer      Plan  Female Wellness Visit: Age appropriate Health Maintenance and Prevention measures were discussed with patient. Included topics are cancer screening recommendations, ways to keep healthy (see AVS) including dietary and exercise recommendations, regular eye and dental care, use of seat belts, and avoidance of moderate alcohol use and tobacco  use.  Screening mammogram and referral to GI for colonoscopy BMI: discussed patient's BMI and encouraged positive lifestyle modifications to help get to or maintain a target BMI. HM needs and immunizations were addressed and ordered. See below for orders. See HM and immunization section for updates.  Declines Shingrix after education, she will consider in the future however Routine labs and screening tests ordered including cmp, cbc and lipids where appropriate. Discussed recommendations regarding Vit D and calcium supplementation (see AVS)  Chronic disease management visit and/or acute problem visit: Continue HRT Has orthopedic surgeon, will schedule total left knee replacement when she is ready.  Follow up: 12 months for complete physical Orders Placed This Encounter  Procedures   Lipid panel   Comprehensive metabolic panel   CBC with Differential/Platelet   Hepatitis C antibody   No orders of the defined types were placed in this encounter.     Body mass index is 30.63 kg/m. Wt Readings from Last 3 Encounters:  08/07/22 191 lb 3.2 oz (86.7 kg)  08/07/22 187 lb (84.8 kg)  01/19/22 192 lb (87.1 kg)     Patient Active Problem List   Diagnosis Date Noted   Migraine 08/07/2022   Mixed conductive and sensorineural hearing loss 08/07/2022   Postmenopausal HRT (hormone replacement therapy) 08/07/2022   Status post total knee replacement, right 04/09/2020   Primary osteoarthritis of right knee 03/22/2020   Vasomotor symptoms due to menopause 03/22/2020   Health Maintenance  Topic Date Due   Hepatitis C Screening  Never done   Colonoscopy  Never done  MAMMOGRAM  Never done   COVID-19 Vaccine (1) 08/23/2022 (Originally 01/08/1971)   Zoster Vaccines- Shingrix (1 of 2) 11/07/2022 (Originally 07/09/2018)   INFLUENZA VACCINE  09/21/2022   PAP SMEAR-Modifier  08/07/2027   DTaP/Tdap/Td (3 - Td or Tdap) 03/06/2030   HIV Screening  Completed   HPV VACCINES  Aged Out    Immunization History  Administered Date(s) Administered   Tdap 10/21/2017, 03/06/2020   We updated and reviewed the patient's past history in detail and it is documented below. Allergies: Patient is allergic to bee venom and oxycontin [oxycodone]. Past Medical History Patient  has a past medical history of Arthritis, Family history of adverse reaction to anesthesia, and Pneumonia (2019). Past Surgical History Patient  has a past surgical history that includes Wisdom tooth extraction and Total knee arthroplasty (Right, 04/09/2020). Family History: Patient family history includes Cancer in her mother; Cervical cancer in her maternal aunt and sister; Heart attack in her maternal grandfather; Prostate cancer in her father. Social History:  Patient  reports that she quit smoking about 21 years ago. Her smoking use included cigarettes. She has a 10.00 pack-year smoking history. She has never used smokeless tobacco. She reports that she does not drink alcohol and does not use drugs.  Review of Systems: Constitutional: negative for fever or malaise Ophthalmic: negative for photophobia, double vision or loss of vision Cardiovascular: negative for chest pain, dyspnea on exertion, or new LE swelling Respiratory: negative for SOB or persistent cough Gastrointestinal: negative for abdominal pain, change in bowel habits or melena Genitourinary: negative for dysuria or gross hematuria, no abnormal uterine bleeding or disharge Musculoskeletal: negative for new gait disturbance or muscular weakness Integumentary: negative for new or persistent rashes, no breast lumps Neurological: negative for TIA or stroke symptoms Psychiatric: negative for SI or delusions Allergic/Immunologic: negative for hives  Patient Care Team    Relationship Specialty Notifications Start End  Willow Ora, MD PCP - General Family Medicine  08/07/22   Tanda Rockers, NP Nurse Practitioner Obstetrics and Gynecology   08/07/22   Spainhour, Kermit Balo, PA Consulting Physician Otolaryngology  08/07/22   Jodi Geralds, MD Consulting Physician Orthopedic Surgery  08/07/22     Objective  Vitals: BP 100/78   Pulse 72   Temp 98.6 F (37 C)   Ht 5' 6.25" (1.683 m)   Wt 191 lb 3.2 oz (86.7 kg)   SpO2 98%   BMI 30.63 kg/m  General:  Well developed, well nourished, no acute distress  Psych:  Alert and orientedx3,normal mood and affect HEENT:  Normocephalic, atraumatic, non-icteric sclera,  supple neck without adenopathy, mass or thyromegaly Cardiovascular:  Normal S1, S2, RRR without gallop, rub or murmur Respiratory:  Good breath sounds bilaterally, CTAB with normal respiratory effort Gastrointestinal: normal bowel sounds, soft, non-tender, no noted masses. No HSM MSK: extremities without edema, joints without erythema or swelling Neurologic:    Mental status is normal.  Gross motor and sensory exams are normal.  No tremor  Commons side effects, risks, benefits, and alternatives for medications and treatment plan prescribed today were discussed, and the patient expressed understanding of the given instructions. Patient is instructed to call or message via MyChart if he/she has any questions or concerns regarding our treatment plan. No barriers to understanding were identified. We discussed Red Flag symptoms and signs in detail. Patient expressed understanding regarding what to do in case of urgent or emergency type symptoms.  Medication list was reconciled, printed and provided to the patient  in AVS. Patient instructions and summary information was reviewed with the patient as documented in the AVS. This note was prepared with assistance of Dragon voice recognition software. Occasional wrong-word or sound-a-like substitutions may have occurred due to the inherent limitations of voice recognition software

## 2022-08-08 ENCOUNTER — Encounter: Payer: Self-pay | Admitting: Family Medicine

## 2022-08-08 LAB — COMPREHENSIVE METABOLIC PANEL
ALT: 13 U/L (ref 0–35)
AST: 14 U/L (ref 0–37)
Albumin: 4.4 g/dL (ref 3.5–5.2)
Alkaline Phosphatase: 50 U/L (ref 39–117)
BUN: 16 mg/dL (ref 6–23)
CO2: 28 mEq/L (ref 19–32)
Calcium: 9.1 mg/dL (ref 8.4–10.5)
Chloride: 104 mEq/L (ref 96–112)
Creatinine, Ser: 0.75 mg/dL (ref 0.40–1.20)
GFR: 90.47 mL/min (ref >60.00)
Glucose, Bld: 91 mg/dL (ref 70–99)
Potassium: 4 mEq/L (ref 3.5–5.1)
Sodium: 140 mEq/L (ref 135–145)
Total Bilirubin: 0.5 mg/dL (ref 0.2–1.2)
Total Protein: 7 g/dL (ref 6.0–8.3)

## 2022-08-08 LAB — CBC WITH DIFFERENTIAL/PLATELET
Basophils Absolute: 0 10*3/uL (ref 0.0–0.1)
Basophils Relative: 0.2 % (ref 0.0–3.0)
Eosinophils Absolute: 0.1 10*3/uL (ref 0.0–0.7)
Eosinophils Relative: 1.3 % (ref 0.0–5.0)
HCT: 44.7 % (ref 36.0–46.0)
Hemoglobin: 14.8 g/dL (ref 12.0–15.0)
Lymphocytes Relative: 39.6 % (ref 12.0–46.0)
Lymphs Abs: 2.2 10*3/uL (ref 0.7–4.0)
MCHC: 33.2 g/dL (ref 30.0–36.0)
MCV: 94.5 fl (ref 78.0–100.0)
Monocytes Absolute: 0.4 10*3/uL (ref 0.1–1.0)
Monocytes Relative: 6.7 % (ref 3.0–12.0)
Neutro Abs: 2.9 10*3/uL (ref 1.4–7.7)
Neutrophils Relative %: 52.2 % (ref 43.0–77.0)
Platelets: 205 10*3/uL (ref 150.0–400.0)
RBC: 4.74 Mil/uL (ref 3.87–5.11)
RDW: 13.7 % (ref 11.5–15.5)
WBC: 5.6 10*3/uL (ref 4.0–10.5)

## 2022-08-08 LAB — LIPID PANEL
Cholesterol: 163 mg/dL (ref 0–200)
HDL: 49 mg/dL (ref >39.00)
LDL Cholesterol: 100 mg/dL — ABNORMAL HIGH (ref 0–99)
NonHDL: 113.6
Total CHOL/HDL Ratio: 3
Triglycerides: 70 mg/dL (ref 0.0–149.0)
VLDL: 14 mg/dL (ref 0.0–40.0)

## 2022-08-08 NOTE — Telephone Encounter (Signed)
Please offer to schedule at an outside imaging center they will get her in faster. Thanks

## 2022-08-09 LAB — URINE CULTURE
MICRO NUMBER:: 15090349
Result:: NO GROWTH
SPECIMEN QUALITY:: ADEQUATE

## 2022-08-09 LAB — URINALYSIS, COMPLETE W/RFL CULTURE
Bacteria, UA: NONE SEEN /HPF
Bilirubin Urine: NEGATIVE
Glucose, UA: NEGATIVE
Hgb urine dipstick: NEGATIVE
Hyaline Cast: NONE SEEN /LPF
Ketones, ur: NEGATIVE
Nitrites, Initial: NEGATIVE
Protein, ur: NEGATIVE
RBC / HPF: NONE SEEN /HPF (ref 0–2)
Specific Gravity, Urine: 1.01 (ref 1.001–1.035)
pH: 6 (ref 5.0–8.0)

## 2022-08-09 LAB — HEPATITIS C ANTIBODY: Hepatitis C Ab: REACTIVE — AB

## 2022-08-09 LAB — CULTURE INDICATED

## 2022-08-09 NOTE — Telephone Encounter (Signed)
Spoke with Abigail Snyder, patient scheduled at [facility] on 08/11/22 at 0900, arrive at 0845 with full bladder.   Spoke with patient, advised of appt as seen above. Patient verbalizes understanding and is agreeable.   Routing to provider for final review. Patient is agreeable to disposition. Will close encounter.

## 2022-08-11 ENCOUNTER — Ambulatory Visit (HOSPITAL_BASED_OUTPATIENT_CLINIC_OR_DEPARTMENT_OTHER)
Admission: RE | Admit: 2022-08-11 | Discharge: 2022-08-11 | Disposition: A | Discharge status: Home / Self Care | Payer: 59 | Source: Ambulatory Visit | Attending: Radiology | Admitting: Radiology

## 2022-08-11 DIAGNOSIS — R102 Pelvic and perineal pain: Secondary | ICD-10-CM | POA: Diagnosis not present

## 2022-08-11 LAB — HEPATITIS C RNA QUANTITATIVE
HCV Quantitative Log: <1.18 Log IU/mL
HCV RNA, PCR, QN: <15 IU/mL

## 2022-08-16 ENCOUNTER — Encounter: Payer: Self-pay | Admitting: Family Medicine

## 2022-08-16 DIAGNOSIS — R768 Other specified abnormal immunological findings in serum: Secondary | ICD-10-CM | POA: Insufficient documentation

## 2022-08-16 NOTE — Progress Notes (Signed)
See mychart note; + hep C antibody w/ negative hep C rna (<15 not detected) The 10-year ASCVD risk score (Arnett DK, et al., 2019) is: 1%   Values used to calculate the score:     Age: 52 years     Sex: Female     Is Non-Hispanic African American: No     Diabetic: No     Tobacco smoker: No     Systolic Blood Pressure: 100 mmHg     Is BP treated: No     HDL Cholesterol: 49 mg/dL     Total Cholesterol: 163 mg/dL

## 2022-08-17 ENCOUNTER — Telehealth: Payer: Self-pay

## 2022-08-17 DIAGNOSIS — K047 Periapical abscess without sinus: Secondary | ICD-10-CM | POA: Diagnosis not present

## 2022-08-17 NOTE — Telephone Encounter (Signed)
Patient called stating that she has been put on amoxicillin from the urgent care for a sore in her mouth. She is wanting to know if Clearnce Hasten will give her Diflucan because she states that she typically get a yeast infection when on Amoxicillin.

## 2022-08-18 MED ORDER — FLUCONAZOLE 150 MG PO TABS: 150.0000 mg | ORAL_TABLET | Freq: Once | ORAL | 0 refills | Status: AC

## 2022-08-18 NOTE — Telephone Encounter (Signed)
I diflucan sent

## 2022-08-21 DIAGNOSIS — M542 Cervicalgia: Secondary | ICD-10-CM | POA: Diagnosis not present

## 2022-08-21 NOTE — Telephone Encounter (Signed)
Will route to provider for final review and close.  

## 2022-08-22 ENCOUNTER — Encounter: Payer: Self-pay | Admitting: Family Medicine

## 2022-08-22 DIAGNOSIS — Z8619 Personal history of other infectious and parasitic diseases: Secondary | ICD-10-CM | POA: Insufficient documentation

## 2022-08-30 ENCOUNTER — Ambulatory Visit: Payer: 59 | Admitting: Radiology

## 2022-09-11 ENCOUNTER — Emergency Department
Admission: EM | Admit: 2022-09-11 | Discharge: 2022-09-11 | Disposition: A | Discharge status: Home / Self Care | Payer: 59 | Attending: Emergency Medicine | Admitting: Emergency Medicine

## 2022-09-11 ENCOUNTER — Other Ambulatory Visit: Payer: Self-pay

## 2022-09-11 DIAGNOSIS — R21 Rash and other nonspecific skin eruption: Secondary | ICD-10-CM | POA: Diagnosis not present

## 2022-09-11 DIAGNOSIS — L237 Allergic contact dermatitis due to plants, except food: Secondary | ICD-10-CM | POA: Insufficient documentation

## 2022-09-11 MED ORDER — HYDROXYZINE HCL 25 MG PO TABS: 25.0000 mg | ORAL_TABLET | Freq: Three times a day (TID) | ORAL | 0 refills | Status: DC | PRN

## 2022-09-11 MED ORDER — PREDNISONE 20 MG PO TABS: 60.0000 mg | ORAL_TABLET | Freq: Every day | ORAL | 0 refills | Status: DC

## 2022-09-11 MED ORDER — HYDROXYZINE HCL 25 MG PO TABS
25.0000 mg | ORAL_TABLET | Freq: Once | ORAL | Status: AC
  Administered 2022-09-11: 25 mg via ORAL
  Filled 2022-09-11: qty 1

## 2022-09-11 MED ORDER — PREDNISONE 20 MG PO TABS
60.0000 mg | ORAL_TABLET | Freq: Once | ORAL | Status: AC
  Administered 2022-09-11: 60 mg via ORAL
  Filled 2022-09-11: qty 3

## 2022-09-11 MED ORDER — PREDNISONE 20 MG PO TABS: 60.0000 mg | ORAL_TABLET | Freq: Every day | ORAL | 0 refills | Status: AC

## 2022-09-11 MED ORDER — HYDROXYZINE HCL 25 MG PO TABS: 25.0000 mg | ORAL_TABLET | Freq: Three times a day (TID) | ORAL | 0 refills | Status: AC | PRN

## 2022-09-11 NOTE — ED Triage Notes (Signed)
Pt to ed from home via POV for poison IVY that started 5 days ago. Pt is itching all over and has tried all of the OTC creams with no relief. It is all over her body. Pt is caox4, in no acute distress and ambulatory in triage.

## 2022-09-11 NOTE — ED Provider Notes (Signed)
[facility] Provider Note    Event Date/Time   First MD Initiated Contact with Patient 09/11/22 0403     (approximate)   History   Chief Complaint Rash (Poison IVY)   HPI  Abigail Snyder is a 52 y.o. female with past medical history of migraines and hepatitis C who presents to the ED complaining of rash.  Patient reports that she has been dealing with increasing itchy rash with vesicles over her arms, legs, and trunk over the past 5 days.  She states that she was out working in the woods and developed the rash shortly afterwards, believes she has poison ivy.  She has not noticed any fevers and has not had any skin peeling, redness, or purulent drainage.     Physical Exam   Triage Vital Signs: ED Triage Vitals  Encounter Vitals Group     BP 09/11/22 0210 111/66     Systolic BP Percentile --      Diastolic BP Percentile --      Pulse Rate 09/11/22 0210 70     Resp 09/11/22 0210 16     Temp 09/11/22 0210 (!) 97.3 F (36.3 C)     Temp Source 09/11/22 0210 Oral     SpO2 09/11/22 0210 98 %     Weight --      Height 09/11/22 0212 5\' 6"  (1.676 m)     Head Circumference --      Peak Flow --      Pain Score 09/11/22 0212 0     Pain Loc --      Pain Education --      Exclude from Growth Chart --     Most recent vital signs: Vitals:   09/11/22 0210  BP: 111/66  Pulse: 70  Resp: 16  Temp: (!) 97.3 F (36.3 C)  SpO2: 98%    Constitutional: Alert and oriented. Eyes: Conjunctivae are normal. Head: Atraumatic. Nose: No congestion/rhinnorhea. Mouth/Throat: Mucous membranes are moist.  Cardiovascular: Normal rate, regular rhythm. Grossly normal heart sounds.  2+ radial pulses bilaterally. Respiratory: Normal respiratory effort.  No retractions. Lungs CTAB. Gastrointestinal: Soft and nontender. No distention. Musculoskeletal: No lower extremity tenderness nor edema.  Diffuse vesicular rash over forearms and trunk. Neurologic:  Normal speech and  language. No gross focal neurologic deficits are appreciated.    ED Results / Procedures / Treatments   Labs (all labs ordered are listed, but only abnormal results are displayed) Labs Reviewed - No data to display   PROCEDURES:  Critical Care performed: No  Procedures   MEDICATIONS ORDERED IN ED: Medications  predniSONE (DELTASONE) tablet 60 mg (has no administration in time range)  hydrOXYzine (ATARAX) tablet 25 mg (has no administration in time range)     IMPRESSION / MDM / ASSESSMENT AND PLAN / ED COURSE  I reviewed the triage vital signs and the nursing notes.                              52 y.o. female with past medical history of migraines and hepatitis C who presents to the ED complaining of diffuse vesicular rash over the past 5 days.  Patient's presentation is most consistent with acute, uncomplicated illness.  Differential diagnosis includes, but is not limited to, poison ivy dermatitis, cellulitis, pemphigus.  Patient nontoxic-appearing and in no acute distress, vital signs are unremarkable.  Examination consistent with poison ivy dermatitis, patient states that she  has been using over-the-counter medications without relief.  We will start her on oral prednisone and also provide Atarax to use as needed.  She was counseled to follow-up with her PCP and to return to the ED for new or worsening symptoms.  Patient agrees with plan.      FINAL CLINICAL IMPRESSION(S) / ED DIAGNOSES   Final diagnoses:  Poison ivy dermatitis     Rx / DC Orders   ED Discharge Orders          Ordered    predniSONE (DELTASONE) 20 MG tablet  Daily with breakfast        09/11/22 0425    hydrOXYzine (ATARAX) 25 MG tablet  3 times daily PRN        09/11/22 0425             Note:  This document was prepared using Dragon voice recognition software and may include unintentional dictation errors.   Chesley Noon, MD 09/11/22 [phone]

## 2022-09-13 ENCOUNTER — Ambulatory Visit: Payer: 59 | Admitting: Physician Assistant

## 2022-09-13 DIAGNOSIS — L255 Unspecified contact dermatitis due to plants, except food: Secondary | ICD-10-CM | POA: Diagnosis not present

## 2022-09-13 DIAGNOSIS — L0100 Impetigo, unspecified: Secondary | ICD-10-CM | POA: Diagnosis not present

## 2022-09-21 DIAGNOSIS — L255 Unspecified contact dermatitis due to plants, except food: Secondary | ICD-10-CM | POA: Diagnosis not present

## 2022-12-04 ENCOUNTER — Ambulatory Visit: Payer: 59 | Admitting: Family Medicine

## 2022-12-04 ENCOUNTER — Encounter: Payer: Self-pay | Admitting: Family Medicine

## 2022-12-04 VITALS — BP 110/74 | HR 106 | Temp 98.2°F | Ht 66.0 in | Wt 187.0 lb

## 2022-12-04 DIAGNOSIS — L249 Irritant contact dermatitis, unspecified cause: Secondary | ICD-10-CM | POA: Diagnosis not present

## 2022-12-04 MED ORDER — TRIAMCINOLONE ACETONIDE 0.1 % EX CREA: 1.0000 | TOPICAL_CREAM | Freq: Every day | CUTANEOUS | 0 refills | Status: DC

## 2022-12-04 NOTE — Progress Notes (Signed)
Subjective  CC:  Chief Complaint  Patient presents with   Belepharitis    Pt noticed Lt eye was swollen when she got up this morning.    HPI: Abigail Snyder is a 52 y.o. female who presents to the office today to address the problems listed above in the chief complaint. Noted red mildly irritated upper lid last night, worse this am. Some itching. Red and raised bumps. Used mascara over the weekend but otherwise no new irritants identified. 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 Right eye is fine. No eye sxs or redness or drainage. Otherwise feels fine    Assessment  1. Irritant dermatitis      Plan  Possible irritant dermatitis of the upper eyelid:  trial of tiny amount of triamcinolone mixed with eye cream nightly x 7 nights and zyrtec. F/u if worsening.   Follow up: prn Visit date not found  No orders of the defined types were placed in this encounter.  Meds ordered this encounter  Medications   triamcinolone cream (KENALOG) 0.1 %    Sig: Apply 1 Application topically at bedtime for 7 days.    Dispense:  15 g    Refill:  0      I reviewed the patients updated PMH, FH, and SocHx.    Patient Active Problem List   Diagnosis Date Noted   Migraine 08/07/2022    Priority: High   Primary osteoarthritis of right knee 03/22/2020    Priority: High   Mixed conductive and sensorineural hearing loss 08/07/2022    Priority: Medium    Postmenopausal HRT (hormone replacement therapy) 08/07/2022    Priority: Medium    Status post total knee replacement, right 04/09/2020    Priority: Medium    Vasomotor symptoms due to menopause 03/22/2020    Priority: Medium    History of hepatitis C 08/22/2022    Priority: Low   Hepatitis C antibody test positive 08/16/2022    Priority: Low   Current Meds  Medication Sig   Cholecalciferol (VITAMIN D-3) 125 MCG (5000 UT) TABS Take by mouth.   DHEA 25 MG CAPS Take by mouth.   Ferrous Gluconate (IRON 27 PO) Take by mouth.    Multiple Vitamins-Minerals (MULTIVIT/MULTIMINERAL ADULT PO) Take by mouth.   NON FORMULARY Estrogen pellet every 3 months,   phentermine (ADIPEX-P) 37.5 MG tablet Take 37.5 mg by mouth daily before breakfast. Pt is taking 1/2 pill   progesterone (PROMETRIUM) 200 MG capsule Take 1 capsule (200 mg total) by mouth at bedtime.   testosterone cypionate (DEPOTESTOSTERONE CYPIONATE) 200 MG/ML injection Inject 1 mL (200 mg total) into the muscle every 30 (thirty) days.   triamcinolone cream (KENALOG) 0.1 % Apply 1 Application topically at bedtime for 7 days.    Allergies: Patient is allergic to bee venom and oxycontin [oxycodone]. Family History: Patient family history includes Cancer in her mother; Cervical cancer in her maternal aunt and sister; Heart attack in her maternal grandfather; Prostate cancer in her father. Social History:  Patient  reports that she quit smoking about 21 years ago. Her smoking use included cigarettes. She started smoking about 31 years ago. She has a 10 pack-year smoking history. She has never used smokeless tobacco. She reports that she does not drink alcohol and does not use drugs.  Review of Systems: Constitutional: Negative for fever malaise or anorexia Cardiovascular: negative for chest pain Respiratory: negative for SOB or persistent cough Gastrointestinal: negative for abdominal pain  Objective  Vitals: BP 110/74   Pulse (!) 106   Temp 98.2 F (36.8 C)   Ht 5\' 6"  (1.676 m)   Wt 187 lb (84.8 kg)   SpO2 95%   BMI 30.18 kg/m  General: no acute distress , A&Ox3 HEENT: PEERL, conjunctiva normal, neck is supple Left upper eyelid with small erythema with micropapillomas. No blisters or ulceration or edema or warmth, normal conjunciva Commons side effects, risks, benefits, and alternatives for medications and treatment plan prescribed today were discussed, and the patient expressed understanding of the given instructions. Patient is instructed to call or message  via MyChart if he/she has any questions or concerns regarding our treatment plan. No barriers to understanding were identified. We discussed Red Flag symptoms and signs in detail. Patient expressed understanding regarding what to do in case of urgent or emergency type symptoms.  Medication list was reconciled, printed and provided to the patient in AVS. Patient instructions and summary information was reviewed with the patient as documented in the AVS. This note was prepared with assistance of Dragon voice recognition software. Occasional wrong-word or sound-a-like substitutions may have occurred due to the inherent limitations of voice recognition software

## 2022-12-04 NOTE — Patient Instructions (Addendum)
Please return in June 2024 for your annual complete physical; please come fasting.   If you have any questions or concerns, please don't hesitate to send me a message via MyChart or call the office at [phone]. Thank you for visiting with [place] today! It's our pleasure caring for you.   Please call the office checked below to schedule your appointment for your mammogram and/or bone density screen (the checked studies were ordered): [x]   Mammogram  []   Bone Density  [x]   [facility]     [address] [place], [place]        [phone]         []   [facility]  [address] [place], [place]  [phone]   Please call [facility] to get scheduled for your colonoscopy.  [phone]

## 2022-12-05 ENCOUNTER — Other Ambulatory Visit: Payer: Self-pay

## 2022-12-05 ENCOUNTER — Telehealth: Payer: Self-pay | Admitting: Family Medicine

## 2022-12-05 MED ORDER — TRIAMCINOLONE ACETONIDE 0.1 % EX CREA: 1.0000 | TOPICAL_CREAM | Freq: Every day | CUTANEOUS | 0 refills | Status: AC

## 2022-12-05 NOTE — Telephone Encounter (Signed)
Patient states the following RX was sent to wrong Pharmacy. Please resend as follows:  Prescription Request  12/05/2022  LOV: 12/04/2022  What is the name of the medication or equipment? triamcinolone cream (KENALOG) 0.1 %   Have you contacted your pharmacy to request a refill? Yes   Which pharmacy would you like this sent to?  [id number] [place] [address] [place] [address] Phone: [phone] Fax: [phone]    Patient notified that their request is being sent to the clinical staff for review and that they should receive a response within 2 business days.   Please advise at Mobile [phone] (mobile)

## 2023-01-01 ENCOUNTER — Ambulatory Visit: Payer: 59 | Admitting: Family Medicine

## 2023-01-08 ENCOUNTER — Ambulatory Visit: Payer: 59 | Admitting: Family Medicine

## 2023-03-22 DIAGNOSIS — M25562 Pain in left knee: Secondary | ICD-10-CM | POA: Diagnosis not present

## 2023-03-22 DIAGNOSIS — M1712 Unilateral primary osteoarthritis, left knee: Secondary | ICD-10-CM | POA: Diagnosis not present

## 2023-05-25 DIAGNOSIS — S0993XA Unspecified injury of face, initial encounter: Secondary | ICD-10-CM | POA: Diagnosis not present

## 2023-07-18 ENCOUNTER — Telehealth: Payer: Self-pay

## 2023-07-18 NOTE — Telephone Encounter (Signed)
 Copied from CRM [id number]. Topic: Referral - Request for Referral >> Jul 18, 2023  8:23 AM [place] A wrote: Did the patient discuss referral with their provider in the last year? Yes (If No - schedule appointment) (If Yes - send message)  Appointment offered? Yes  Type of order/referral and detailed reason for visit: Weight Loss   Preference of office, provider, location: [facility] in [place]  If referral order, have you been seen by this specialty before? Yes (If Yes, this issue or another issue? When? Where? [facility]  Can we respond through MyChart? Yes  Ok to place referral to healthy Weight

## 2023-07-19 ENCOUNTER — Other Ambulatory Visit: Payer: Self-pay

## 2023-07-19 NOTE — Telephone Encounter (Signed)
 referral has been placed

## 2023-08-13 DIAGNOSIS — Z6830 Body mass index (BMI) 30.0-30.9, adult: Secondary | ICD-10-CM | POA: Diagnosis not present

## 2023-08-13 DIAGNOSIS — E66811 Obesity, class 1: Secondary | ICD-10-CM | POA: Diagnosis not present

## 2023-08-22 ENCOUNTER — Encounter: Admitting: Family Medicine

## 2023-08-30 ENCOUNTER — Ambulatory Visit: Admitting: Radiology

## 2023-08-31 ENCOUNTER — Ambulatory Visit: Admitting: Radiology

## 2023-08-31 ENCOUNTER — Encounter: Payer: Self-pay | Admitting: Radiology

## 2023-08-31 VITALS — BP 112/64 | HR 77 | Ht 66.5 in | Wt 190.2 lb

## 2023-08-31 DIAGNOSIS — Z1331 Encounter for screening for depression: Secondary | ICD-10-CM | POA: Diagnosis not present

## 2023-08-31 DIAGNOSIS — Z01419 Encounter for gynecological examination (general) (routine) without abnormal findings: Secondary | ICD-10-CM

## 2023-08-31 DIAGNOSIS — Z79890 Hormone replacement therapy (postmenopausal): Secondary | ICD-10-CM

## 2023-08-31 MED ORDER — ESTRADIOL 0.1 MG/GM VA CREA
1.0000 g | TOPICAL_CREAM | VAGINAL | 3 refills | Status: AC
Start: 2023-08-31 — End: ?

## 2023-08-31 MED ORDER — ZEPBOUND 5 MG/0.5ML ~~LOC~~ SOLN
5.0000 mg | SUBCUTANEOUS | 0 refills | Status: DC
Start: 2023-08-31 — End: 2023-10-19

## 2023-08-31 MED ORDER — ESTRADIOL 0.1 MG/24HR TD PTTW
1.0000 | MEDICATED_PATCH | TRANSDERMAL | 4 refills | Status: DC
Start: 2023-09-03 — End: 2023-10-19

## 2023-08-31 MED ORDER — PROGESTERONE 200 MG PO CAPS: 200.0000 mg | ORAL_CAPSULE | Freq: Every day | ORAL | 4 refills | Status: DC

## 2023-08-31 NOTE — Progress Notes (Signed)
 Abigail Snyder 04/05/1970 [id number]   History: Postmenopausal 53 y.o. presents for annual exam and HRT consult. Has been getting pellets progesterone  and injectable from testosterone  from [facility] and Zepbound  from [facility]. Would like to switch back to getting patches and meds here. Will take a breast from testosterone  for now, had acne from it. No improvement in libido on it. Hoping to have knee surgery next year. Has to lose weight first, would like to increase zepbound  dose to 5mg .    Gynecologic History Postmenopausal Last Pap: 2023. Results were: normal Last mammogram: never  Obstetric History OB History  Gravida Para Term Preterm AB Living  4 2   2 1   SAB IAB Ectopic Multiple Live Births   2   2    # Outcome Date GA Lbr Len/2nd Weight Sex Type Anes PTL Lv  4 IAB           3 IAB           2 Para           1 Para                08/31/2023   11:37 AM 08/07/2022    2:05 PM  Depression screen PHQ 2/9  Decreased Interest 0 0  Down, Depressed, Hopeless 0 1  PHQ - 2 Score 0 1     The following portions of the patient's history were reviewed and updated as appropriate: allergies, current medications, past family history, past medical history, past social history, past surgical history, and problem list.  Review of Systems Pertinent items noted in HPI and remainder of comprehensive ROS otherwise negative.  Past medical history, past surgical history, family history and social history were all reviewed and documented in the EPIC chart.  Exam:  Vitals:   08/31/23 1126  BP: 112/64  Pulse: 77  SpO2: 99%  Weight: 190 lb 3.2 oz (86.3 kg)  Height: 5' 6.5 (1.689 m)   Body mass index is 30.24 kg/m.  General appearance:  Normal Thyroid :  Symmetrical, normal in size, without palpable masses or nodularity. Respiratory  Auscultation:  Clear without wheezing or rhonchi Cardiovascular  Auscultation:  Regular rate, without rubs, murmurs or gallops  Edema/varicosities:   Not grossly evident Abdominal  Soft,nontender, without masses, guarding or rebound.  Liver/spleen:  No organomegaly noted  Hernia:  None appreciated  Skin  Inspection:  Grossly normal Breasts: Examined lying and sitting.   Right: Without masses, retractions, nipple discharge or axillary adenopathy.   Left: Without masses, retractions, nipple discharge or axillary adenopathy. Genitourinary   Inguinal/mons:  Normal without inguinal adenopathy  External genitalia:  Normal appearing vulva with no masses, tenderness, or lesions  BUS/Urethra/Skene's glands:  Normal  Vagina:  Normal appearing with normal color and discharge, no lesions. Atrophy: mild   Cervix:  Normal appearing without discharge or lesions  Uterus:  Normal in size, shape and contour.  Midline and mobile, nontender  Adnexa/parametria:     Rt: Normal in size, without masses or tenderness.   Lt: Normal in size, without masses or tenderness.  Anus and perineum: Normal    Abigail Snyder, CMA present for exam  Assessment/Plan:   1. Well woman exam with routine gynecological exam (Primary) Pap 2027 Schedule mammogram  2. Hormone replacement therapy (HRT) - estradiol  (VIVELLE -DOT) 0.1 MG/24HR patch; Place 1 patch (0.1 mg total) onto the skin 2 (two) times a week.  Dispense: 24 patch; Refill: 4 - progesterone  (PROMETRIUM ) 200 MG capsule; Take 1  capsule (200 mg total) by mouth at bedtime.  Dispense: 90 capsule; Refill: 4 - estradiol  (ESTRACE  VAGINAL) 0.1 MG/GM vaginal cream; Place 1 g vaginally 3 (three) times a week.  Dispense: 42.5 g; Refill: 3  3. Depression screen negative  4. Morbid obesity (HCC) - tirzepatide  (ZEPBOUND ) 5 MG/0.5ML injection vial; Inject 5 mg into the skin once a week.  Dispense: 2 mL; Refill: 0   Follow up 4 weeks for weight check  Abigail Snyder B WHNP-BC, 12:05 PM 08/31/2023

## 2023-09-26 ENCOUNTER — Other Ambulatory Visit: Payer: Self-pay | Admitting: Radiology

## 2023-09-27 NOTE — Telephone Encounter (Signed)
 Med refill request: Zepbound  5 mg Last AEX: 08/31/23 Next OV:  09/25/23 Next AEX: none Last MMG (if hormonal med) n/a Refill authorized: Please Advise?

## 2023-09-28 ENCOUNTER — Ambulatory Visit: Admitting: Radiology

## 2023-10-17 ENCOUNTER — Telehealth: Payer: Self-pay

## 2023-10-17 NOTE — Telephone Encounter (Signed)
 Cut the patch in half until her appt. Continue progesterone  200mg  nightly.

## 2023-10-17 NOTE — Telephone Encounter (Signed)
 Patient notified of recommendations & she agrees.

## 2023-10-17 NOTE — Telephone Encounter (Signed)
 Patient called & stated that she has an ov scheduled for Friday for wt loss med f/u and issues with HRT. Patient states after 10 yrs that she is having a heavy period. She is having breakouts. She is using estrace  vaginal cream, vivelle  dot patch 0.1, progesterone  200mg . Please advise.

## 2023-10-19 ENCOUNTER — Ambulatory Visit: Admitting: Radiology

## 2023-10-19 ENCOUNTER — Encounter: Payer: Self-pay | Admitting: Radiology

## 2023-10-19 DIAGNOSIS — Z79890 Hormone replacement therapy (postmenopausal): Secondary | ICD-10-CM | POA: Diagnosis not present

## 2023-10-19 DIAGNOSIS — N941 Unspecified dyspareunia: Secondary | ICD-10-CM

## 2023-10-19 MED ORDER — ESTRADIOL 0.05 MG/24HR TD PTTW
1.0000 | MEDICATED_PATCH | TRANSDERMAL | 2 refills | Status: DC
Start: 2023-10-22 — End: 2023-12-10

## 2023-10-19 MED ORDER — INTRAROSA 6.5 MG VA INST
1.0000 | VAGINAL_INSERT | Freq: Every evening | VAGINAL | 4 refills | Status: AC
Start: 2023-10-19 — End: ?

## 2023-10-19 MED ORDER — ZEPBOUND 7.5 MG/0.5ML ~~LOC~~ SOLN
7.5000 mg | SUBCUTANEOUS | 1 refills | Status: DC
Start: 2023-10-19 — End: 2023-12-13

## 2023-10-19 NOTE — Progress Notes (Signed)
   Abigail Snyder Jan 05, 1971 [id number]   History:  53 y.o. G4P1 presents for weight management and HRT follow up. Lost 10lbs since last visit on Zepbound  5mg , had BTB x 4 days on HRT, cut patch in half. No sex drive since stopping testosterone . Pain with intercourse, tearing despite using estrace  cream. Has also tried imvexxy  with no improvement.  Gynecologic History No LMP recorded. Patient is postmenopausal. Contraception/Family planning: post menopausal status Sexually active: yes   Obstetric History OB History  Gravida Para Term Preterm AB Living  4 2   2 1   SAB IAB Ectopic Multiple Live Births   2   2    # Outcome Date GA Lbr Len/2nd Weight Sex Type Anes PTL Lv  4 IAB           3 IAB           2 Para           1 Para              Review of Systems  All other systems reviewed and are negative.   Past medical history, past surgical history, family history and social history were all reviewed and documented in the EPIC chart.  Exam:  Vitals:   10/19/23 0831  BP: 122/76  Weight: 180 lb (81.6 kg)   Body mass index is 28.62 kg/m.  Physical Exam Vitals and nursing note reviewed. Exam conducted with a chaperone present.  Constitutional:      Appearance: Normal appearance. She is well-developed.  Pulmonary:     Effort: Pulmonary effort is normal.  Abdominal:     General: Abdomen is flat.     Palpations: Abdomen is soft.  Genitourinary:    General: Normal vulva.     Vagina: No vaginal discharge, erythema, bleeding or lesions.     Cervix: Normal. No discharge, friability, lesion or erythema.     Uterus: Normal.      Adnexa: Right adnexa normal and left adnexa normal.     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  Psychiatric:        Mood and Affect: Mood normal.        Thought Content: Thought content normal.        Judgment: Judgment normal.       Starting weight:200 Current  weight:180 First goal:165 Overall weight loss goal:150  Assessment/Plan:   1. Morbid obesity (HCC) (Primary) Will increase dose, weight check 8 weeks - tirzepatide  (ZEPBOUND ) 7.5 MG/0.5ML injection vial; Inject 7.5 mg into the skin once a week.  Dispense: 2 mL; Refill: 1  2. Dyspareunia in female Continue estrace  on the vulva every night x 2 weeks then 3 times a week - Prasterone  (INTRAROSA ) 6.5 MG INST; Place 1 Insert vaginally at bedtime.  Dispense: 30 each; Refill: 4  3. Hormone replacement therapy (HRT) Decrease patch dose to stop any further bleeding - estradiol  (VIVELLE -DOT) 0.05 MG/24HR patch; Place 1 patch (0.05 mg total) onto the skin 2 (two) times a week.  Dispense: 24 patch; Refill: 2    Risks and benefits discussed. Continue to focus on protein and fiber. Small frequent meals. Adequate hydration and electrolyte replacement. Will continue to exercise for at least a week including weight bearing exercise.   Return in about 8 weeks (around 12/14/2023) for Med Follow-up .  GINETTE COZIER B WHNP-BC 9:22 AM 10/19/2023

## 2023-10-31 ENCOUNTER — Telehealth: Payer: Self-pay

## 2023-10-31 NOTE — Telephone Encounter (Signed)
 Patient advised of recommendations.

## 2023-10-31 NOTE — Telephone Encounter (Signed)
 Patient left message on triage voicemail stating that she has started bleeding for the 2nd time this month. She is only spotting and only wearing a mini pad. She states that she gave herself testosterone  a small amount about a week ago.She has started the lower dose of the patch. She is due for another shot of testosterone  but is unsure if that is the answer. She also has the shingles and is taking valtrex She is unsure what to do. Please advise. SABRA

## 2023-10-31 NOTE — Telephone Encounter (Signed)
 She can continue the testosterone  and the lower dose of estrogen. It may take a bit for bleeding to stop completely after coming down from the higher doses.

## 2023-11-28 ENCOUNTER — Ambulatory Visit

## 2023-11-28 DIAGNOSIS — L739 Follicular disorder, unspecified: Secondary | ICD-10-CM

## 2023-11-28 DIAGNOSIS — Z1283 Encounter for screening for malignant neoplasm of skin: Secondary | ICD-10-CM | POA: Diagnosis not present

## 2023-11-28 DIAGNOSIS — L814 Other melanin hyperpigmentation: Secondary | ICD-10-CM

## 2023-11-28 DIAGNOSIS — L578 Other skin changes due to chronic exposure to nonionizing radiation: Secondary | ICD-10-CM | POA: Diagnosis not present

## 2023-11-28 DIAGNOSIS — D489 Neoplasm of uncertain behavior, unspecified: Secondary | ICD-10-CM | POA: Diagnosis not present

## 2023-11-28 DIAGNOSIS — W908XXA Exposure to other nonionizing radiation, initial encounter: Secondary | ICD-10-CM

## 2023-11-28 NOTE — Patient Instructions (Addendum)

## 2023-11-28 NOTE — Progress Notes (Signed)
    Subjective   Abigail Snyder is a 53 y.o. female who presents for the following: Lesion(s) of concern . Patient is new patient  Today patient reports: Area of concern on the right cheek.  Review of Systems:    No other skin or systemic complaints except as noted in HPI or Assessment and Plan.  The following portions of the chart were reviewed this encounter and updated as appropriate: medications, allergies, medical history  Relevant Medical History:  Family history of skin cancer - Brother melanoma of face    Objective  Well appearing patient in no apparent distress; mood and affect are within normal limits. Examination was performed of the: face  Examination notable for: below  - 6-20 mm pigmented macules that are tan to brown in color and are somewhat non-uniform in shape and concentrated in the sun-exposed areas - Actinic Damage/Elastosis: chronic sun damage: dyspigmentation, telangiectasia, and wrinkling Examination limited by: Clothing and Patient deferred removal     Right Cheek 5mm pink scaly papule    Assessment & Plan   SKIN CANCER SCREENING PERFORMED TODAY.  BENIGN SKIN FINDINGS  - Lentigines - Reassurance provided regarding the benign appearance of lesions noted on exam today; no treatment is indicated in the absence of symptoms/changes. - Reinforced importance of photoprotective strategies including liberal and frequent sunscreen use of a broad-spectrum SPF 30 or greater, use of protective clothing, and sun avoidance for prevention of cutaneous malignancy and photoaging.  Counseled patient on the importance of regular self-skin monitoring as well as routine clinical skin examinations as scheduled.   ACTINIC DAMAGE - Chronic condition, secondary to cumulative UV/sun exposure - Recommend daily broad spectrum sunscreen SPF 30+ to sun-exposed areas, reapply every 2 hours as needed.  - Staying in the shade or wearing long sleeves, sun glasses (UVA+UVB protection)  and wide brim hats (4-inch brim around the entire circumference of the hat) are also recommended for sun protection.  - Call for new or changing lesions.  Level of service outlined above   Procedures, orders, diagnosis for this visit:  NEOPLASM OF UNCERTAIN BEHAVIOR Right Cheek Skin / nail biopsy Type of biopsy: tangential   Informed consent: discussed and consent obtained   Timeout: patient name, date of birth, surgical site, and procedure verified   Procedure prep:  Patient was prepped and draped in usual sterile fashion Prep type:  Isopropyl alcohol Anesthesia: the lesion was anesthetized in a standard fashion   Anesthetic:  1% lidocaine  w/ epinephrine  1-100,000 buffered w/ 8.4% NaHCO3 Instrument used: DermaBlade   Hemostasis achieved with: pressure and aluminum chloride   Outcome: patient tolerated procedure well   Post-procedure details: sterile dressing applied and wound care instructions given   Dressing type: bandage and petrolatum    Specimen 1 - Surgical pathology Differential Diagnosis: BCC vs folliculitis / cyst vs SCC vs SK vs Other   Check Margins: No  Neoplasm of uncertain behavior -     Skin / nail biopsy -     Surgical pathology; Standing    Return to clinic: Return in about 1 year (around 11/27/2024) for TBSE.  Documentation: I, Emerick Ege, CMA am acting as scribe for Lauraine JAYSON Kanaris, MD.   I have reviewed the above documentation for accuracy and completeness, and I agree with the above.  Lauraine JAYSON Kanaris, MD

## 2023-12-04 LAB — SURGICAL PATHOLOGY

## 2023-12-05 ENCOUNTER — Other Ambulatory Visit: Payer: Self-pay | Admitting: Radiology

## 2023-12-05 ENCOUNTER — Ambulatory Visit: Payer: Self-pay

## 2023-12-05 NOTE — Progress Notes (Signed)
Patient informed and voiced good understanding.

## 2023-12-06 NOTE — Telephone Encounter (Signed)
 Med refill request:ZEPBOUND  7.5 MG  Last AEX: 08/31/23 Next AEX: next OV 12/13/23 Last MMG (if hormonal med) Refill authorized: Last rx 10/19/23 2mL with 1 refill. Please approve or deny

## 2023-12-10 ENCOUNTER — Other Ambulatory Visit: Payer: Self-pay | Admitting: Radiology

## 2023-12-10 DIAGNOSIS — Z79890 Hormone replacement therapy (postmenopausal): Secondary | ICD-10-CM

## 2023-12-10 NOTE — Telephone Encounter (Signed)
 Med refill request: estradiol  (VIVELLE -DOT) 0.05 MG/24HR patch  Start:  10/22/23 Disp: 24 patch Refills:  2  Last AEX: 08/31/23 Next OV: 12/13/23 Next AEX:  Not yet scheduled Last MMG (if hormonal med):  No info found Refill authorized? Please Advise.

## 2023-12-13 ENCOUNTER — Encounter: Payer: Self-pay | Admitting: Radiology

## 2023-12-13 ENCOUNTER — Ambulatory Visit: Admitting: Radiology

## 2023-12-13 MED ORDER — ZEPBOUND 12.5 MG/0.5ML ~~LOC~~ SOLN: 12.5000 mg | SUBCUTANEOUS | 0 refills | Status: DC

## 2023-12-13 MED ORDER — ZEPBOUND 10 MG/0.5ML ~~LOC~~ SOLN: 10.0000 mg | SUBCUTANEOUS | 0 refills | Status: DC

## 2023-12-13 MED ORDER — ZEPBOUND 15 MG/0.5ML ~~LOC~~ SOLN: 15.0000 mg | SUBCUTANEOUS | 1 refills | Status: DC

## 2023-12-13 NOTE — Progress Notes (Signed)
   Abigail Snyder 07/17/1970 [id number]   History:  53 y.o. G4P2 presents for weight management. Has lost 20lbs since start zepbound . No side effects. Ready to increase the dose.  Gynecologic History No LMP recorded. Patient is postmenopausal.   Obstetric History OB History  Gravida Para Term Preterm AB Living  4 2   2 1   SAB IAB Ectopic Multiple Live Births   2   2    # Outcome Date GA Lbr Len/2nd Weight Sex Type Anes PTL Lv  4 IAB           3 IAB           2 Para           1 Para              Review of Systems  All other systems reviewed and are negative.   Past medical history, past surgical history, family history and social history were all reviewed and documented in the EPIC chart.  Exam:  Vitals:   12/13/23 1117  BP: 104/68  Weight: 172 lb (78 kg)   Body mass index is 27.35 kg/m.  Physical Exam Constitutional:      Appearance: Normal appearance.  Pulmonary:     Effort: Pulmonary effort is normal.  Neurological:     Mental Status: She is alert.  Psychiatric:        Mood and Affect: Mood normal.        Thought Content: Thought content normal.        Judgment: Judgment normal.      Starting weight:192 Current weight:172 First goal:170 Overall weight loss goal:150  Assessment/Plan:   1. Morbid obesity (HCC) (Primary) - tirzepatide  (ZEPBOUND ) 10 MG/0.5ML injection vial; Inject 10 mg into the skin once a week.  Dispense: 2 mL; Refill: 0 - Tirzepatide -Weight Management (ZEPBOUND ) 12.5 MG/0.5ML SOLN; Inject 12.5 mg into the skin once a week.  Dispense: 2 mL; Refill: 0 - Tirzepatide -Weight Management (ZEPBOUND ) 15 MG/0.5ML SOLN; Inject 15 mg into the skin once a week.  Dispense: 2 mL; Refill: 1    Risks and benefits discussed. Continue to focus on protein and fiber. Small frequent meals. Adequate hydration and electrolyte replacement. Will continue to exercise for at least a week including weight bearing exercise.   Return in about 3 months  (around 03/14/2024) for Med Follow-up.  GINETTE COZIER B WHNP-BC 11:47 AM 12/13/2023

## 2023-12-20 ENCOUNTER — Telehealth: Payer: Self-pay | Admitting: *Deleted

## 2023-12-20 NOTE — Telephone Encounter (Signed)
 It is herbal- reviews are mixed but she can try it if she wants. Ingredients don't interact with any of her meds.

## 2023-12-20 NOTE — Telephone Encounter (Signed)
 Patient notified. Encounter closed

## 2023-12-20 NOTE — Telephone Encounter (Signed)
 Patient left message on triage line. Patient would like to start vaginal supplement Meno for vaginal moisture. She is on estrogen and progesterone . States she did not like previous supplements or creams recommended, would like to know if Meno ok to try?   Routing to Jami

## 2024-01-01 ENCOUNTER — Other Ambulatory Visit: Payer: Self-pay | Admitting: Radiology

## 2024-01-01 DIAGNOSIS — Z79890 Hormone replacement therapy (postmenopausal): Secondary | ICD-10-CM

## 2024-01-15 ENCOUNTER — Telehealth: Payer: Self-pay

## 2024-01-15 NOTE — Telephone Encounter (Signed)
 Looks like estradiol  was decreased because she was having bleeding. If she is not having bleeding at this time she does not need to lower dose unless that is just her preference. Continue Progesterone  200 mg nightly as this is helping to prevent bleeding with estrogen. Continuing testosterone  is up to her.

## 2024-01-15 NOTE — Telephone Encounter (Signed)
 Patient states at her last visit that she had lost weight due to zepbound  so Jami lowered her estrogen patch  from 0.1mg  to 0.05mg  1 patch twice a week. Her weight in July was 190 & now she said she is at 166. Patient states since she has lost more weight she wanted to know if her estrogen should be lowered more. She also wanted to know if she should still be using her testosterone  injectable. She said she was using it but not using it much now. She said her progesterone  is still at 200mg  She says she is starting to have anxiety attacks & will follow up with her pcp regarding this.  Patient is aware that [facility] is out of the office and a covering provider will review this. Please advise.

## 2024-01-15 NOTE — Telephone Encounter (Signed)
 Patient notified or recommendations. She was appreciative of the help.

## 2024-01-29 ENCOUNTER — Other Ambulatory Visit: Payer: Self-pay | Admitting: Radiology

## 2024-01-30 NOTE — Telephone Encounter (Signed)
 Med refill request: Zepbound  15 mg Last AEX: 08/31/23 Last OV: 12/13/23 Next AEX: none scheduled Next OV: 03/13/24 Last MMG (if hormonal med) n/a DX: morbid obesity Last refill: 01/09/2024 Refill sent to provider for approval or denial.

## 2024-03-13 ENCOUNTER — Ambulatory Visit: Admitting: Radiology

## 2024-03-19 ENCOUNTER — Other Ambulatory Visit: Payer: Self-pay | Admitting: Radiology

## 2024-03-19 DIAGNOSIS — Z79890 Hormone replacement therapy (postmenopausal): Secondary | ICD-10-CM

## 2024-03-19 NOTE — Telephone Encounter (Signed)
 Med refill request:  pharmacy requested 90 day supply and dx code for:  estradiol  0.05 mg patch RX was sent 12/10/23 for #24 with 3 refills and DX: Z79.890 Last AEX: 08/31/23 Next AEX: none scheduled Next OV: 03/20/24 Last MMG (if hormonal med) never Last refill: 02/27/2024 Pharmacy notified RX was sent for 90 day supply with 3 refills 12/10/23 and DX: Z79.890

## 2024-03-20 ENCOUNTER — Ambulatory Visit: Admitting: Radiology

## 2024-03-20 ENCOUNTER — Encounter: Payer: Self-pay | Admitting: Radiology

## 2024-03-20 VITALS — BP 104/68 | Wt 160.0 lb

## 2024-03-20 DIAGNOSIS — F520 Hypoactive sexual desire disorder: Secondary | ICD-10-CM | POA: Diagnosis not present

## 2024-03-20 DIAGNOSIS — Z79890 Hormone replacement therapy (postmenopausal): Secondary | ICD-10-CM

## 2024-03-20 MED ORDER — ZEPBOUND 15 MG/0.5ML ~~LOC~~ SOLN: 15.0000 mg | SUBCUTANEOUS | 5 refills | Status: AC

## 2024-03-20 MED ORDER — ESTRADIOL 0.05 MG/24HR TD PTTW: 1.0000 | MEDICATED_PATCH | TRANSDERMAL | 6 refills | Status: AC

## 2024-03-20 MED ORDER — ADDYI 100 MG PO TABS: 1.0000 | ORAL_TABLET | Freq: Every evening | ORAL | 1 refills | Status: AC

## 2024-03-20 MED ORDER — PROGESTERONE 200 MG PO CAPS: 200.0000 mg | ORAL_CAPSULE | Freq: Every evening | ORAL | 5 refills | Status: AC

## 2024-03-20 MED ORDER — TESTOSTERONE 20.25 MG/ACT (1.62%) TD GEL: 1.0000 | Freq: Every evening | TRANSDERMAL | 0 refills | Status: AC

## 2024-03-20 NOTE — Progress Notes (Signed)
" ° °  Abigail Snyder [birth year] [id number]   History:  54 y.o. G4P2 presents for weight management. Has lost 32lbs since start zepbound . No side effects besides occasional constipation. HRT patch brand changed due to national shortage, causing a rash ( did not have a rash with Sandoz brand). Would like to try androgel , compounded cream caused too much hair growth on her legs.   Gynecologic History No LMP recorded. Patient is postmenopausal.   Obstetric History OB History  Gravida Para Term Preterm AB Living  4 2   2 1   SAB IAB Ectopic Multiple Live Births   2   2    # Outcome Date GA Lbr Len/2nd Weight Sex Type Anes PTL Lv  4 IAB           3 IAB           2 Para           1 Para              Review of Systems  All other systems reviewed and are negative.   Past medical history, past surgical history, family history and social history were all reviewed and documented in the EPIC chart.  Exam:  Vitals:   03/20/24 0921  BP: 104/68  Weight: 160 lb (72.6 kg)   Body mass index is 25.44 kg/m.  Physical Exam Constitutional:      Appearance: Normal appearance.  Pulmonary:     Effort: Pulmonary effort is normal.  Neurological:     Mental Status: She is alert.  Psychiatric:        Mood and Affect: Mood normal.        Thought Content: Thought content normal.        Judgment: Judgment normal.      Starting weight:192 Current weight:160 First goal:170 Overall weight loss goal:150  Assessment/Plan:   1. Morbid obesity (HCC) - Tirzepatide -Weight Management (ZEPBOUND ) 15 MG/0.5ML SOLN; Inject 15 mg into the skin once a week.  Dispense: 2 mL; Refill: 5  2. Hypoactive sexual desire disorder (Primary) - Flibanserin  (ADDYI ) 100 MG TABS; Take 1 tablet (100 mg total) by mouth at bedtime.  Dispense: 90 tablet; Refill: 1  3. Hormone replacement therapy (HRT) - progesterone  (PROMETRIUM ) 200 MG capsule; Take 1 capsule (200 mg total) by mouth at bedtime.  Dispense: 30 capsule;  Refill: 5 - Testosterone  20.25 MG/ACT (1.62%) GEL; Place 1 Pump onto the skin at bedtime.  Dispense: 75 g; Refill: 0 - estradiol  (DOTTI ) 0.05 MG/24HR patch; Place 1 patch (0.05 mg total) onto the skin 2 (two) times a week.  Dispense: 8 patch; Refill: 6    Risks and benefits discussed. Continue to focus on protein and fiber. Small frequent meals. Adequate hydration and electrolyte replacement. Will continue to exercise for at least a week including weight bearing exercise.   Return in about 6 months (around 09/17/2024) for Annual.  GINETTE COZIER B WHNP-BC 10:03 AM 03/20/2024  "

## 2024-03-27 ENCOUNTER — Telehealth: Payer: Self-pay

## 2024-03-27 NOTE — Telephone Encounter (Signed)
 Prior authorization request received from covermymeds for Addyi  100 mg.  PA initiated.  Patient notified.   KEY: [id number]

## 2024-03-28 NOTE — Telephone Encounter (Signed)
 Prior authorization for Addyi  was denied:  DECISION: Coverage for Addyi  is denied. This medication is not covered by the plan  Sent to provider for review.

## 2024-03-28 NOTE — Telephone Encounter (Signed)
 Patient was notified regarding [facility] pharmacy and cash pay for Addyi .  Pharmacy information was given to patient.

## 2024-03-28 NOTE — Telephone Encounter (Signed)
 Addyi  is cash pay through [facility]

## 2024-06-19 ENCOUNTER — Ambulatory Visit: Admitting: Radiology

## 2024-11-27 ENCOUNTER — Ambulatory Visit
